# Patient Record
Sex: Male | Born: 1970 | Race: White | Hispanic: No | Smoking: Current every day smoker
Health system: Southern US, Community
[De-identification: ages and names within clinical notes are randomized; demographics above are authoritative.]

## PROBLEM LIST (undated history)

## (undated) DIAGNOSIS — F329 Major depressive disorder, single episode, unspecified: Secondary | ICD-10-CM

## (undated) DIAGNOSIS — F32A Depression, unspecified: Secondary | ICD-10-CM

## (undated) DIAGNOSIS — I10 Essential (primary) hypertension: Secondary | ICD-10-CM

## (undated) HISTORY — PX: ELBOW FRACTURE SURGERY: SHX616

## (undated) HISTORY — PX: ABDOMINAL SURGERY: SHX537

---

## 2009-08-03 ENCOUNTER — Emergency Department (HOSPITAL_COMMUNITY): Admission: EM | Admit: 2009-08-03 | Discharge: 2009-08-03 | Payer: Self-pay | Admitting: Emergency Medicine

## 2012-03-12 ENCOUNTER — Emergency Department (HOSPITAL_COMMUNITY)
Admission: EM | Admit: 2012-03-12 | Discharge: 2012-03-12 | Disposition: A | Discharge status: Home / Self Care | Payer: No Typology Code available for payment source | Attending: Emergency Medicine | Admitting: Emergency Medicine

## 2012-03-12 ENCOUNTER — Emergency Department (HOSPITAL_COMMUNITY): Payer: No Typology Code available for payment source

## 2012-03-12 ENCOUNTER — Encounter (HOSPITAL_COMMUNITY): Payer: Self-pay | Admitting: Emergency Medicine

## 2012-03-12 DIAGNOSIS — R05 Cough: Secondary | ICD-10-CM | POA: Insufficient documentation

## 2012-03-12 DIAGNOSIS — Z79899 Other long term (current) drug therapy: Secondary | ICD-10-CM | POA: Insufficient documentation

## 2012-03-12 DIAGNOSIS — R059 Cough, unspecified: Secondary | ICD-10-CM | POA: Insufficient documentation

## 2012-03-12 DIAGNOSIS — B349 Viral infection, unspecified: Secondary | ICD-10-CM

## 2012-03-12 DIAGNOSIS — I10 Essential (primary) hypertension: Secondary | ICD-10-CM | POA: Insufficient documentation

## 2012-03-12 DIAGNOSIS — F17200 Nicotine dependence, unspecified, uncomplicated: Secondary | ICD-10-CM | POA: Insufficient documentation

## 2012-03-12 DIAGNOSIS — B9789 Other viral agents as the cause of diseases classified elsewhere: Secondary | ICD-10-CM | POA: Insufficient documentation

## 2012-03-12 DIAGNOSIS — J3489 Other specified disorders of nose and nasal sinuses: Secondary | ICD-10-CM | POA: Insufficient documentation

## 2012-03-12 DIAGNOSIS — R6883 Chills (without fever): Secondary | ICD-10-CM | POA: Insufficient documentation

## 2012-03-12 DIAGNOSIS — J029 Acute pharyngitis, unspecified: Secondary | ICD-10-CM | POA: Insufficient documentation

## 2012-03-12 HISTORY — DX: Essential (primary) hypertension: I10

## 2012-03-12 HISTORY — DX: Major depressive disorder, single episode, unspecified: F32.9

## 2012-03-12 HISTORY — DX: Depression, unspecified: F32.A

## 2012-03-12 LAB — RAPID STREP SCREEN (MED CTR MEBANE ONLY): Streptococcus, Group A Screen (Direct): NEGATIVE

## 2012-03-12 MED ORDER — IBUPROFEN 400 MG PO TABS
400.0000 mg | ORAL_TABLET | Freq: Once | ORAL | Status: AC
  Administered 2012-03-12: 400 mg via ORAL
  Filled 2012-03-12: qty 1

## 2012-03-12 MED ORDER — ACETAMINOPHEN 500 MG PO TABS
1000.0000 mg | ORAL_TABLET | Freq: Once | ORAL | Status: AC
  Administered 2012-03-12: 1000 mg via ORAL
  Filled 2012-03-12: qty 2

## 2012-03-12 NOTE — ED Provider Notes (Signed)
History     CSN: [id number]  Arrival date & time 03/12/12  0809   First MD Initiated Contact with Patient 03/12/12 [phone]      Chief Complaint  Patient presents with  . Influenza     HPI Pt was seen at 0840.   Per pt, c/o gradual onset and persistence of constant generalized body aches/fatigue, sore throat, runny/stuffy nose, sinus congestion, chills and cough for the past 2-3 days.  Denies rash, no CP/SOB, no N/V/D, no abd pain, no back pain.     Past Medical History  Diagnosis Date  . Hypertension   . Depression     Past Surgical History  Procedure Date  . Elbow fracture surgery     History  Substance Use Topics  . Smoking status: Current Every Day Smoker -- 1.0 packs/day    Types: Cigarettes  . Smokeless tobacco: Not on file  . Alcohol Use: No     Review of Systems ROS: Statement: All systems negative except as marked or noted in the HPI; Constitutional: +fever and chills, generalized body aches/fatigue. ; ; Eyes: Negative for eye pain, redness and discharge. ; ; ENMT: Negative for ear pain, hoarseness, +nasal congestion, sinus pressure and sore throat. ; ; Cardiovascular: Negative for chest pain, palpitations, diaphoresis, dyspnea and peripheral edema. ; ; Respiratory: +cough. Negative for wheezing and stridor. ; ; Gastrointestinal: Negative for nausea, vomiting, diarrhea, abdominal pain, blood in stool, hematemesis, jaundice and rectal bleeding. . ; ; Genitourinary: Negative for dysuria, flank pain and hematuria. ; ; Musculoskeletal: Negative for back pain and neck pain. Negative for swelling and trauma.; ; Skin: Negative for pruritus, rash, abrasions, blisters, bruising and skin lesion.; ; Neuro: Negative for headache, lightheadedness and neck stiffness. Negative for altered level of consciousness , altered mental status, extremity weakness, paresthesias, involuntary movement, seizure and syncope.       Allergies  Amoxapine and related and Amoxicillin  Home  Medications  No current outpatient prescriptions on file.  BP 158/93  Pulse 118  Temp 102.4 F (39.1 C) (Oral)  Resp 24  Ht 6' (1.829 m)  Wt 245 lb (111.131 kg)  BMI 33.23 kg/m2  SpO2 95%  Physical Exam 0845: Physical examination:  Nursing notes reviewed; Vital signs and O2 SAT reviewed;  Constitutional: Well developed, Well nourished, Well hydrated, In no acute distress; Head:  Normocephalic, atraumatic; Eyes: EOMI, PERRL, No scleral icterus; ENMT: TM's clear bilat. +edemetous nasal turbinates bilat with clear rhinorrhea.  Mouth and pharynx without lesions. No tonsillar exudates. No intra-oral edema. No hoarse voice, no drooling, no stridor. No pain with manipulation of larynx. Mouth and pharynx normal, Mucous membranes moist; Neck: Supple, Full range of motion, No lymphadenopathy; Cardiovascular: Tachycardic rate and rhythm, No murmur, rub, or gallop; Respiratory: Breath sounds clear & equal bilaterally, No rales, rhonchi, wheezes.  Speaking full sentences with ease, Normal respiratory effort/excursion; Chest: Nontender, Movement normal; Abdomen: Soft, Nontender, Nondistended, Normal bowel sounds;; Extremities: Pulses normal, No tenderness, No edema, No calf edema or asymmetry.; Neuro: AA&Ox3, Major CN grossly intact.  Speech clear. Gait steady. Climbs on and off stretcher by himself with ease. No gross focal motor or sensory deficits in extremities.; Skin: Color normal, Warm, Dry.   ED Course  Procedures    MDM  MDM Reviewed: nursing note, vitals and previous chart Interpretation: labs and x-ray   Results for orders placed during the hospital encounter of 03/12/12  RAPID STREP SCREEN      Component Value Range   Streptococcus,  Group A Screen (Direct) NEGATIVE  NEGATIVE   Dg Chest 2 View 03/12/2012  *RADIOLOGY REPORT*  Clinical Data: Cough, congestion, fever, smoker  CHEST - 2 VIEW  Comparison:  None.  Findings:  The heart size and mediastinal contours are within normal limits.   Both lungs are clear.  The visualized skeletal structures are unremarkable.  IMPRESSION: No active cardiopulmonary disease.   Original Report Authenticated By: Judie Petit. Shick, M.D.       1115:  Fever improved after tylenol/motrin.  No acute findings on CXR, no strep throat.  Wants to go home now. Dx and testing d/w pt.  Questions answered.  Verb understanding, agreeable to d/c home with outpt f/u.        Laray Anger, DO 03/13/12 Corky Crafts

## 2012-03-12 NOTE — ED Notes (Signed)
Pt sitting in hallway bed awaiting discharge papers.

## 2012-03-12 NOTE — ED Notes (Signed)
Pt states symptoms began two days ago.

## 2014-09-03 IMAGING — CR DG CHEST 2V
2 series · 2 of 2 positions shown · non-contrast
Comparison: None.

CLINICAL DATA: Cough, congestion, fever, smoker

CHEST - 2 VIEW

[view not recorded (1 of 2)]
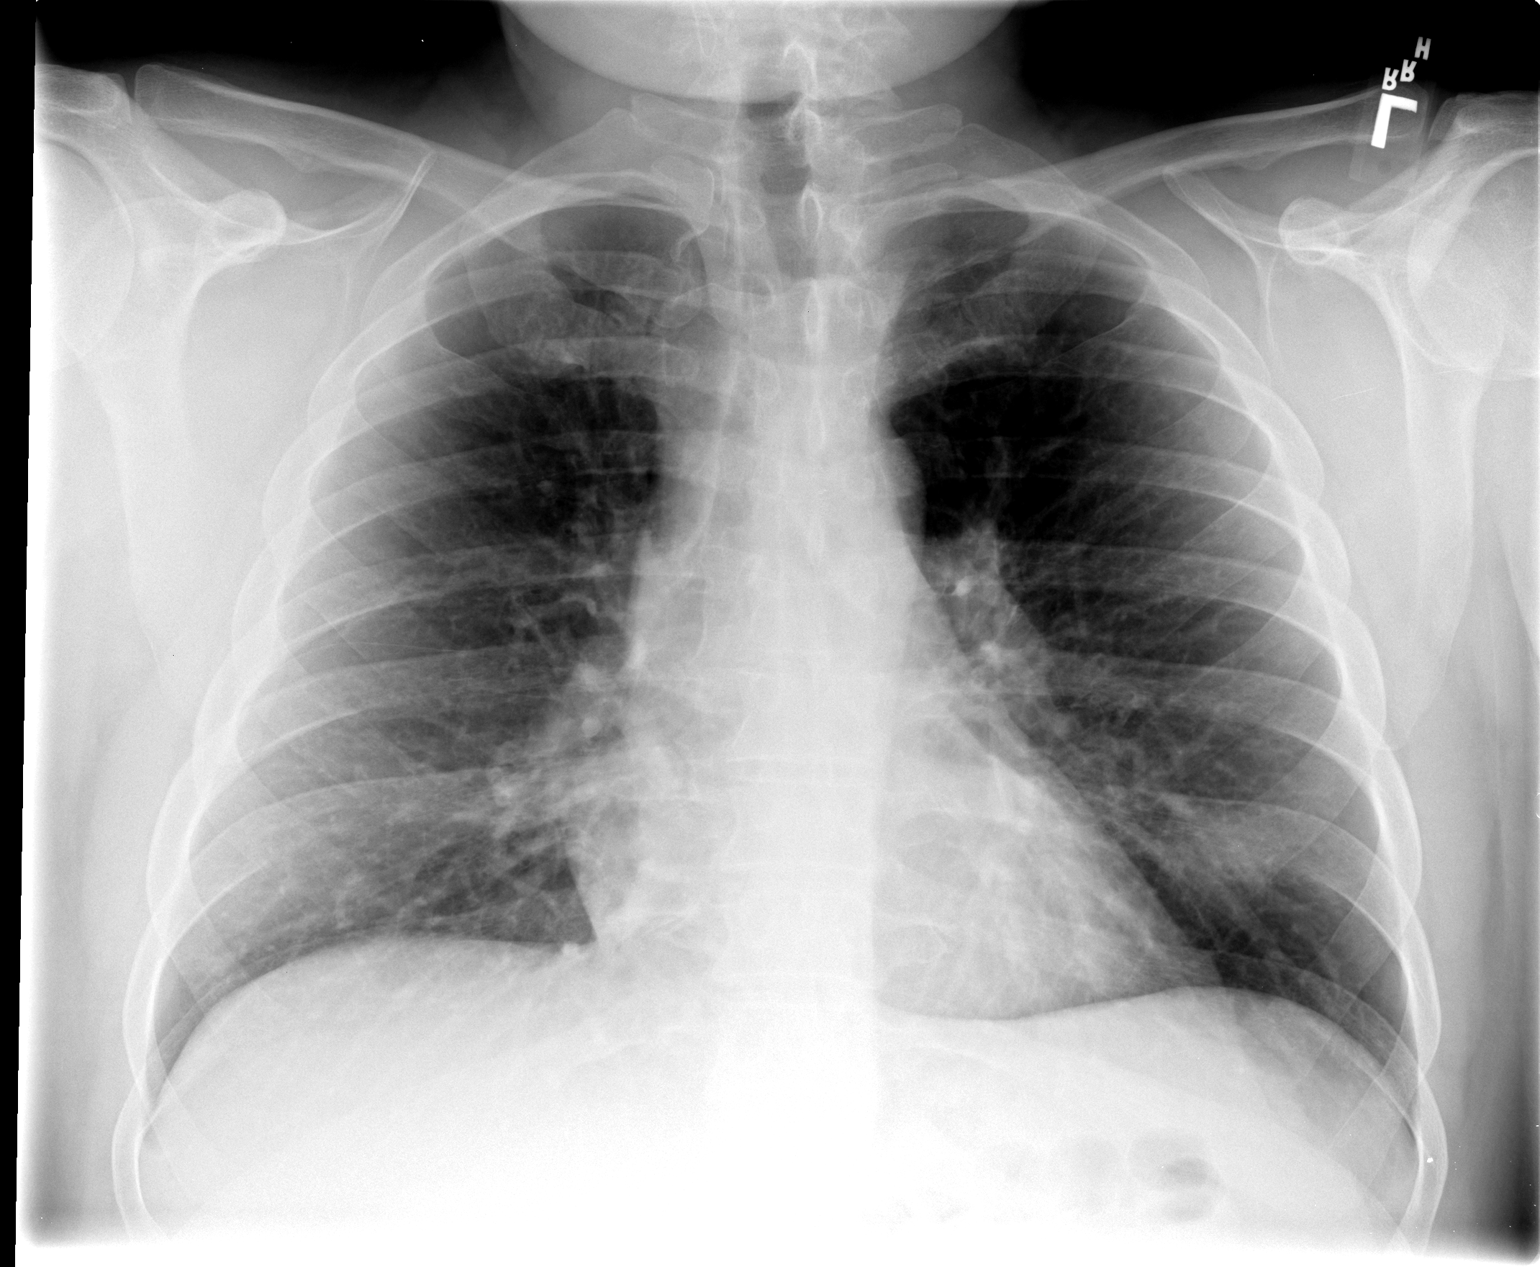

[view not recorded (2 of 2)]
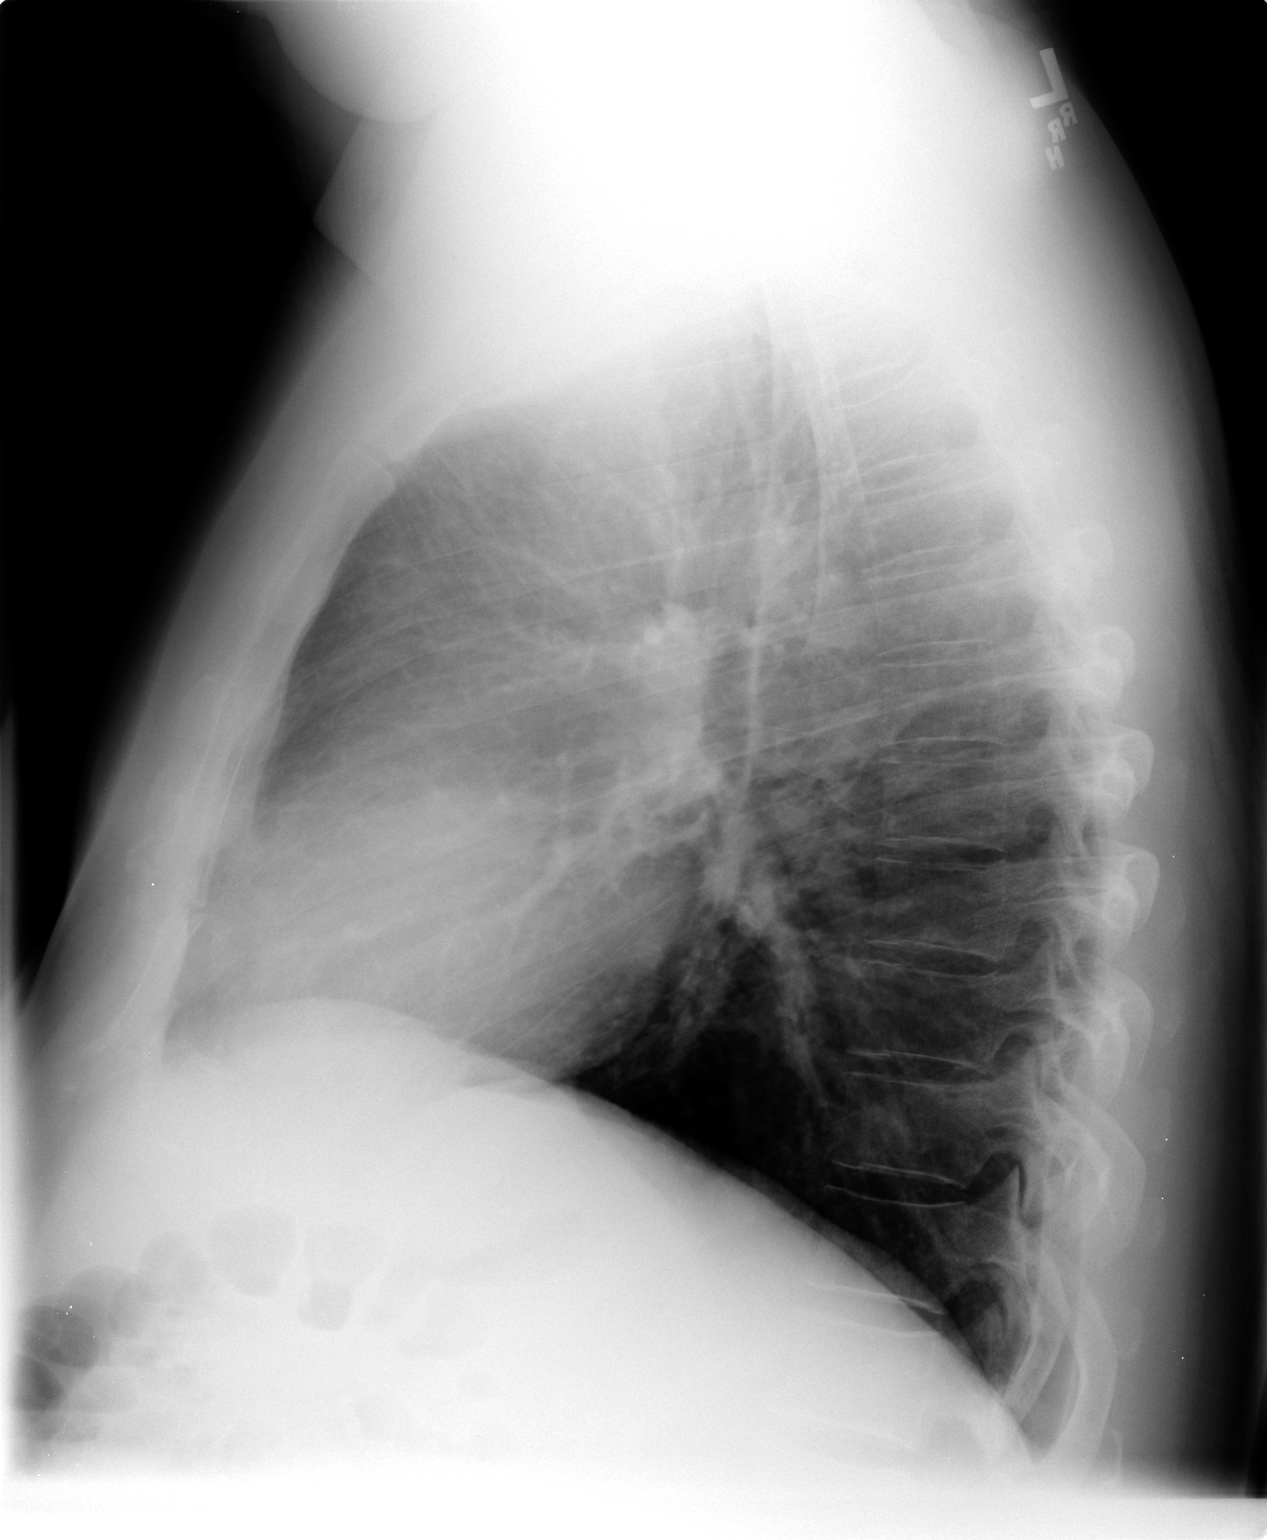

[2 of 2 positions shown; findings below may reference images not displayed]

FINDINGS: The heart size and mediastinal contours are within
normal limits.  Both lungs are clear.  The visualized skeletal
structures are unremarkable.
IMPRESSION: No active cardiopulmonary disease.

## 2019-02-14 ENCOUNTER — Other Ambulatory Visit: Payer: Self-pay

## 2019-02-14 ENCOUNTER — Ambulatory Visit
Admission: EM | Admit: 2019-02-14 | Discharge: 2019-02-14 | Disposition: A | Discharge status: Home / Self Care | Payer: 59
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
## 2019-05-07 ENCOUNTER — Ambulatory Visit: Payer: 59 | Attending: Internal Medicine

## 2019-05-07 DIAGNOSIS — Z23 Encounter for immunization: Secondary | ICD-10-CM | POA: Insufficient documentation

## 2019-05-07 NOTE — Progress Notes (Signed)
   Covid-19 Vaccination Clinic  Name:  GEOFREY SILLIMAN    MRN: [id number] DOB: 06/23/1970  05/07/2019  Mr. Ovens was observed post Covid-19 immunization for 15 minutes without incident. He was provided with Vaccine Information Sheet and instruction to access the V-Safe system.   Mr. Hemann was instructed to call 911 with any severe reactions post vaccine: Marland Kitchen Difficulty breathing  . Swelling of face and throat  . A fast heartbeat  . A bad rash all over body  . Dizziness and weakness   Immunizations Administered    Name Date Dose VIS Date Route   Moderna COVID-19 Vaccine 05/07/2019  9:24 AM 0.5 mL 02/02/2019 Intramuscular   Manufacturer: Moderna   Lot: [id number]   NDC: 18367-255-00

## 2019-06-08 ENCOUNTER — Ambulatory Visit: Payer: 59 | Attending: Internal Medicine

## 2019-06-08 DIAGNOSIS — Z23 Encounter for immunization: Secondary | ICD-10-CM

## 2019-06-08 NOTE — Progress Notes (Signed)
   Covid-19 Vaccination Clinic  Name:  Sean Jordan    MRN: [id number] DOB: November 06, 1970  06/08/2019  Mr. Sean Jordan was observed post Covid-19 immunization for 15 minutes without incident. He was provided with Vaccine Information Sheet and instruction to access the V-Safe system.   Mr. Sean Jordan was instructed to call 911 with any severe reactions post vaccine: Marland Kitchen Difficulty breathing  . Swelling of face and throat  . A fast heartbeat  . A bad rash all over body  . Dizziness and weakness   Immunizations Administered    Name Date Dose VIS Date Route   Moderna COVID-19 Vaccine 06/08/2019 10:21 AM 0.5 mL 02/02/2019 Intramuscular   Manufacturer: Moderna   Lot: [id number]   NDC: 55258-948-34

## 2022-01-10 ENCOUNTER — Encounter (HOSPITAL_COMMUNITY): Payer: Self-pay

## 2022-01-10 ENCOUNTER — Ambulatory Visit (INDEPENDENT_AMBULATORY_CARE_PROVIDER_SITE_OTHER): Payer: 59 | Admitting: Clinical

## 2022-01-10 DIAGNOSIS — F17200 Nicotine dependence, unspecified, uncomplicated: Secondary | ICD-10-CM

## 2022-01-10 DIAGNOSIS — F4310 Post-traumatic stress disorder, unspecified: Secondary | ICD-10-CM | POA: Diagnosis not present

## 2022-01-10 DIAGNOSIS — F1020 Alcohol dependence, uncomplicated: Secondary | ICD-10-CM | POA: Diagnosis not present

## 2022-01-10 DIAGNOSIS — F419 Anxiety disorder, unspecified: Secondary | ICD-10-CM

## 2022-01-10 DIAGNOSIS — F331 Major depressive disorder, recurrent, moderate: Secondary | ICD-10-CM

## 2022-01-10 NOTE — Plan of Care (Signed)
Verbal Consent 
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
## 2022-02-21 ENCOUNTER — Ambulatory Visit (INDEPENDENT_AMBULATORY_CARE_PROVIDER_SITE_OTHER): Payer: 59 | Admitting: Clinical

## 2022-02-21 DIAGNOSIS — F17200 Nicotine dependence, unspecified, uncomplicated: Secondary | ICD-10-CM | POA: Diagnosis not present

## 2022-02-21 DIAGNOSIS — F4310 Post-traumatic stress disorder, unspecified: Secondary | ICD-10-CM | POA: Diagnosis not present

## 2022-02-21 DIAGNOSIS — F1020 Alcohol dependence, uncomplicated: Secondary | ICD-10-CM | POA: Diagnosis not present

## 2022-02-21 DIAGNOSIS — F419 Anxiety disorder, unspecified: Secondary | ICD-10-CM

## 2022-02-21 DIAGNOSIS — F331 Major depressive disorder, recurrent, moderate: Secondary | ICD-10-CM
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
## 2022-03-11 ENCOUNTER — Other Ambulatory Visit (HOSPITAL_COMMUNITY): Payer: Self-pay | Admitting: Internal Medicine

## 2022-03-11 DIAGNOSIS — Z122 Encounter for screening for malignant neoplasm of respiratory organs: Secondary | ICD-10-CM

## 2022-03-11 DIAGNOSIS — F17210 Nicotine dependence, cigarettes, uncomplicated: Secondary | ICD-10-CM

## 2022-03-28 ENCOUNTER — Ambulatory Visit (INDEPENDENT_AMBULATORY_CARE_PROVIDER_SITE_OTHER): Payer: 59 | Admitting: Clinical

## 2022-03-28 DIAGNOSIS — F331 Major depressive disorder, recurrent, moderate: Secondary | ICD-10-CM

## 2022-03-28 DIAGNOSIS — F1020 Alcohol dependence, uncomplicated: Secondary | ICD-10-CM

## 2022-03-28 DIAGNOSIS — F4310 Post-traumatic stress disorder, unspecified: Secondary | ICD-10-CM | POA: Diagnosis not present

## 2022-03-28 DIAGNOSIS — F17200 Nicotine dependence, unspecified, uncomplicated: Secondary | ICD-10-CM | POA: Diagnosis not present

## 2022-03-28 DIAGNOSIS — F419 Anxiety disorder, unspecified: Secondary | ICD-10-CM
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
## 2022-03-29 ENCOUNTER — Ambulatory Visit (HOSPITAL_COMMUNITY)
Admission: RE | Admit: 2022-03-29 | Discharge: 2022-03-29 | Disposition: A | Discharge status: Home / Self Care | Payer: 59 | Source: Ambulatory Visit | Attending: Internal Medicine | Admitting: Internal Medicine

## 2022-03-29 DIAGNOSIS — F17210 Nicotine dependence, cigarettes, uncomplicated: Secondary | ICD-10-CM | POA: Diagnosis present

## 2022-03-29 DIAGNOSIS — Z122 Encounter for screening for malignant neoplasm of respiratory organs: Secondary | ICD-10-CM | POA: Insufficient documentation

## 2022-04-08 ENCOUNTER — Ambulatory Visit: Payer: 59 | Attending: Internal Medicine | Admitting: Internal Medicine

## 2022-04-08 ENCOUNTER — Other Ambulatory Visit: Payer: Self-pay

## 2022-04-08 ENCOUNTER — Encounter: Payer: Self-pay | Admitting: Internal Medicine

## 2022-04-08 VITALS — BP 166/100 | HR 88 | Ht 70.0 in | Wt 189.0 lb

## 2022-04-08 DIAGNOSIS — R0609 Other forms of dyspnea: Secondary | ICD-10-CM | POA: Diagnosis not present

## 2022-04-08 DIAGNOSIS — I10 Essential (primary) hypertension: Secondary | ICD-10-CM

## 2022-04-08 DIAGNOSIS — Z720 Tobacco use: Secondary | ICD-10-CM | POA: Diagnosis not present

## 2022-04-08 DIAGNOSIS — E782 Mixed hyperlipidemia: Secondary | ICD-10-CM

## 2022-04-08 DIAGNOSIS — I2510 Atherosclerotic heart disease of native coronary artery without angina pectoris: Secondary | ICD-10-CM

## 2022-04-08 DIAGNOSIS — I700 Atherosclerosis of aorta: Secondary | ICD-10-CM

## 2022-04-08 DIAGNOSIS — I2584 Coronary atherosclerosis due to calcified coronary lesion: Secondary | ICD-10-CM

## 2022-04-08 MED ORDER — LISINOPRIL 10 MG PO TABS: 10.0000 mg | ORAL_TABLET | Freq: Every day | ORAL | 3 refills | Status: DC

## 2022-04-08 NOTE — Patient Instructions (Signed)
Medication Instructions:  Your physician has recommended you make the following change in your medication:  INCREASE: lisinopril to 10 mg by mouth once daily  *If you need a refill on your cardiac medications before your next appointment, please call your pharmacy*   Lab Work: [place]: BMP If you have labs (blood work) drawn today and your tests are completely normal, you will receive your results only by: [place] (if you have MyChart) OR A paper copy in the mail If you have any lab test that is abnormal or we need to change your treatment, we will call you to review the results.   Testing/Procedures: Your physician has requested that you have en exercise stress myoview. For further information please visit [email or web address]. Please follow instruction sheet, as given.   You are scheduled for a Myocardial Perfusion Imaging Study. Please arrive 15 minutes prior to your appointment time for registration and insurance purposes.   The test will take approximately 3 to 4 hours to complete; you may bring reading material.  If someone comes with you to your appointment, they will need to remain in the main lobby due to limited space in the testing area.    How to prepare for your Myocardial Perfusion Test: Do not eat or drink 3 hours prior to your test, except you may have water. Do not consume products containing caffeine (regular or decaffeinated) 12 hours prior to your test. (ex: coffee, chocolate, sodas, tea). Do bring a list of your current medications with you.  If not listed below, you may take your medications as normal. Do wear comfortable clothes (no dresses or overalls) and walking shoes, tennis shoes preferred (No heels or open toe shoes are allowed). Do NOT wear cologne, perfume, aftershave, or lotions (deodorant is allowed). If these instructions are not followed, your test will have to be rescheduled.  If you cannot keep your appointment,  please provide 24 hours notification to the Nuclear Lab, to avoid a possible $50 charge to your account.       Follow-Up: At [facility], you and your health needs are our priority.  As part of our continuing mission to provide you with exceptional heart care, we have created designated Provider Care Teams.  These Care Teams include your primary Cardiologist (physician) and Advanced Practice Providers (APPs -  Physician Assistants and Nurse Practitioners) who all work together to provide you with the care you need, when you need it.    Your next appointment:   6 month(s)  Provider:   Werner Lean, MD

## 2022-04-08 NOTE — Progress Notes (Signed)
Cardiology Office Note:    Date:  04/08/2022   ID:  Sean Jordan, DOB 09/03/1970, MRN [id number]  PCP:  Eber Hong, [place] Providers Cardiologist:  Werner Lean, MD     Referring MD: Eber Hong, MD   CC: Reviewed incidental findings Consulted for the evaluation of CAC at the behest of Dr. Brynda Greathouse  History of Present Illness:    Sean Jordan is a 52 y.o. male with a hx of CAC in the setting of HLD, HTN.  Patient notes that he is feeling OK.   He works as  Financial trader to rework a Scientist, physiological.  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  No PND or orthopnea.  No weight gain, leg swelling , or abdominal swelling.  No syncope or near syncope . Notes  no palpitations or funny heart beats.     Patient reports prior cardiac testing including CT for lung cancer screening  Down to a pack a day.  Used to be pretty bad. Uses his Apple Watch and sets intervals of where he is aloud to smoke; he has increased the interval every day.   Past Medical History:  Diagnosis Date   Depression    Hypertension     Past Surgical History:  Procedure Laterality Date   ABDOMINAL SURGERY     ELBOW FRACTURE SURGERY      Current Medications: Current Meds  Medication Sig   aspirin EC 81 MG tablet Take by mouth daily.   atorvastatin (LIPITOR) 40 MG tablet Take 40 mg by mouth daily.   buPROPion (WELLBUTRIN SR) 150 MG 12 hr tablet Take 150 mg by mouth 2 (two) times daily.   cetirizine (ZYRTEC) 10 MG tablet Take by mouth daily.   hydrOXYzine (VISTARIL) 25 MG capsule Take 25-50 mg by mouth at bedtime as needed for anxiety or itching.   lisinopril (ZESTRIL) 10 MG tablet Take 1 tablet (10 mg total) by mouth daily.   naproxen (NAPROSYN) 500 MG tablet Take 500 mg by mouth daily at 6 (six) AM.    Omega-3 Fatty Acids (OMEGA-3 2100 PO) Take by mouth daily at 6 (six) AM.   sertraline (ZOLOFT) 100 MG tablet Take 100 mg by mouth daily.   sildenafil (VIAGRA) 100 MG tablet Take 100 mg by mouth as needed for erectile dysfunction.   [DISCONTINUED] lisinopril (PRINIVIL,ZESTRIL) 5 MG tablet Take 5 mg by mouth daily.     Allergies:   Amoxicillin   Social History   Socioeconomic History   Marital status: Divorced    Spouse name: Not on file   Number of children: Not on file   Years of education: Not on file   Highest education level: Not on file  Occupational History   Not on file  Tobacco Use   Smoking status: Every Day    Packs/day: 1.00    Types: Cigarettes   Smokeless tobacco: Never  Substance and Sexual Activity   Alcohol use: Yes    Comment: occ   Drug use: Not on file   Sexual activity: Not on file  Other Topics Concern   Not on file  Social History Narrative   Not on file   Social Determinants of Health   Financial Resource Strain: Not on file  Food Insecurity: Not  on file  Transportation Needs: Not on file  Physical Activity: Not on file  Stress: Not on file  Social Connections: Not on file     Family History: The patient's family history includes Healthy in his father and mother. GF had CHF.  ROS:   Please see the history of present illness.     All other systems reviewed and are negative.  EKGs/Labs/Other Studies Reviewed:    The following studies were reviewed today:   EKG:  EKG is  ordered today.  The ekg ordered today demonstrates  Sr rate 88  NonCardiac CT : Date: 03/29/22 Results: CAC and aortic atherosclerosis    Recent Labs: No results found for requested labs within last 365 days.  Recent Lipid Panel No results found for: "CHOL", "TRIG", "HDL", "CHOLHDL", "VLDL", "LDLCALC", "LDLDIRECT"   Risk Assessment/Calculations:         HYPERTENSION CONTROL Vitals:   04/08/22 1005 04/08/22 1049  BP: (!) 158/98 (!) 166/100    The  patient's blood pressure is elevated above target today.  In order to address the patient's elevated BP: A new medication was prescribed today.            Physical Exam:    VS:  BP (!) 166/100   Pulse 88   Ht 5\' 10"  (1.778 m)   Wt 189 lb (85.7 kg)   SpO2 99%   BMI 27.12 kg/m     Wt Readings from Last 3 Encounters:  04/08/22 189 lb (85.7 kg)  03/12/12 245 lb (111.1 kg)     GEN:  Well nourished, well developed anxious HEENT: F Ear Frank Sign NECK: No JVD; No carotid bruits CARDIAC: RRR, no murmurs, rubs, gallops RESPIRATORY:  Clear to auscultation without rales, wheezing or rhonchi  ABDOMEN: Soft, non-tender, non-distended MUSCULOSKELETAL:  No edema; No deformity  SKIN: Warm and dry NEUROLOGIC:  Alert and oriented x 3 PSYCHIATRIC:  Normal affect   ASSESSMENT:    1. DOE (dyspnea on exertion)   2. Tobacco use   3. Mixed hyperlipidemia   4. Coronary artery calcification   5. Aortic atherosclerosis ([place])   6. Essential hypertension    PLAN:    CAC Aortic atherosclerosis HLD Tobacco abuse DOE - LDL at goal - reviewed CT scan - LDL at goal - will get Exercise NM Stress test - Discussed Tobacco cessation - reviewed the limited evidence of Vitamin C and D for this; limited evidence of niacin - Discussed small positive for L citrulline and L argiine; discussed AE and recs to not start all of them together if he wishes to take OTC supplements - discussed small data for nattokinase  HTN - increase lisinopril to 10 mg; f/u BMP in [place] - AMB BP monitoring - discussed physical activity increase  Summer f/u unless new sx        Medication Adjustments/Labs and Tests Ordered: Current medicines are reviewed at length with the patient today.  Concerns regarding medicines are outlined above.  Orders Placed This Encounter  Procedures   Basic metabolic panel   Cardiac Stress Test: Informed Consent Details: Physician/Practitioner Attestation; Transcribe to  consent form and obtain patient signature   MYOCARDIAL PERFUSION IMAGING   EKG 12-Lead   Meds ordered this encounter  Medications   lisinopril (ZESTRIL) 10 MG tablet    Sig: Take 1 tablet (10 mg total) by mouth daily.    Dispense:  90 tablet    Refill:  3    Patient Instructions  Medication Instructions:  Your physician has recommended you make the following change in your medication:  INCREASE: lisinopril to 10 mg by mouth once daily  *If you need a refill on your cardiac medications before your next appointment, please call your pharmacy*   Lab Work: [place]: BMP If you have labs (blood work) drawn today and your tests are completely normal, you will receive your results only by: [place] (if you have MyChart) OR A paper copy in the mail If you have any lab test that is abnormal or we need to change your treatment, we will call you to review the results.   Testing/Procedures: Your physician has requested that you have en exercise stress myoview. For further information please visit [email or web address]. Please follow instruction sheet, as given.   You are scheduled for a Myocardial Perfusion Imaging Study. Please arrive 15 minutes prior to your appointment time for registration and insurance purposes.   The test will take approximately 3 to 4 hours to complete; you may bring reading material.  If someone comes with you to your appointment, they will need to remain in the main lobby due to limited space in the testing area.    How to prepare for your Myocardial Perfusion Test: Do not eat or drink 3 hours prior to your test, except you may have water. Do not consume products containing caffeine (regular or decaffeinated) 12 hours prior to your test. (ex: coffee, chocolate, sodas, tea). Do bring a list of your current medications with you.  If not listed below, you may take your medications as normal. Do wear comfortable clothes (no dresses or  overalls) and walking shoes, tennis shoes preferred (No heels or open toe shoes are allowed). Do NOT wear cologne, perfume, aftershave, or lotions (deodorant is allowed). If these instructions are not followed, your test will have to be rescheduled.  If you cannot keep your appointment, please provide 24 hours notification to the Nuclear Lab, to avoid a possible $50 charge to your account.       Follow-Up: At [facility], you and your health needs are our priority.  As part of our continuing mission to provide you with exceptional heart care, we have created designated Provider Care Teams.  These Care Teams include your primary Cardiologist (physician) and Advanced Practice Providers (APPs -  Physician Assistants and Nurse Practitioners) who all work together to provide you with the care you need, when you need it.    Your next appointment:   6 month(s)  Provider:   Werner Lean, MD       Signed, Werner Lean, MD  04/08/2022 10:56 AM    [place]

## 2022-04-11 ENCOUNTER — Encounter: Payer: Self-pay | Admitting: Internal Medicine

## 2022-04-11 ENCOUNTER — Telehealth: Payer: Self-pay

## 2022-04-11 ENCOUNTER — Telehealth (HOSPITAL_COMMUNITY): Payer: Self-pay

## 2022-04-11 DIAGNOSIS — I10 Essential (primary) hypertension: Secondary | ICD-10-CM

## 2022-04-11 MED ORDER — LISINOPRIL 40 MG PO TABS: 40.0000 mg | ORAL_TABLET | Freq: Every day | ORAL | 3 refills | Status: DC

## 2022-04-11 NOTE — Telephone Encounter (Signed)
Patient sent in Accident message asking what dose of lisinopril he was supposed to be on, stated he thought he was supposed to be taking 20 mg previously and was told to double his dose at his last visit. Epic shows that he was on 5 mg lisinopril as prescribed by Dr. Dan Maker. On last visit 04/08/22 with Dr. Dan Maker, his BP was 166/100 and 158/98. Dr. Dan Maker increased lisinopril at that time to 10 mg daily. Patient states he received a call from his pharmacy asking whether his dose of lisinopril was supposed to be 20 or 40 mg daily and called [place] to clarify.   I had patient read off the label of his lisinopril bottle to me. He read that the label stated 20 mg of lisinopril (I had him spell it out) and that it was prescribed by Dr. Eber Hong, his PCP. I also checked that his lipitor dosage (40 mg) was correct in case he was getting the names of his medications confused.   Patient stated that he took 2 doses of his 20 mg tablets of lisinopril on 04/09/22 because his understanding was that Dr. Dan Maker had doubled his dosage. Patient states he checked his BP on 04/09/22 and it was 168/107 and later 154/95. He denies any dizziness or lightheadedness.   I advised patient not to double his dosage until we are able to clarify his dosage with Dr. Dan Maker and provided education about symptoms of low pressure. Provided education that increasing his blood pressure medication too quickly can lead to dizziness and lightheadedness, even proceeding to passing out. Patient agrees not to only take 1 tablet of 20 mg lisinopril, which he states he has been on for a long time as prescribed by Dr. Eber Hong his PCP, until Dr. Dan Maker can review his medications and advise what dose he should be on. Patient verbalizes understanding and agrees to plan. Forwarded to Dr. Dan Maker and to PCP.

## 2022-04-11 NOTE — Telephone Encounter (Signed)
Spoke with the patient, detailed instructions given. He stated that he would be here for his test. Asked to call back with any questions. S.Maveryck Bahri EMTP/CCT 

## 2022-04-11 NOTE — Telephone Encounter (Signed)
Spoke with patient and advised per Dr. Gasper Sells that he is to increase lisinopril to 40 mg daily. Orders placed for updated dose at patient's preferred pharmacy. Reviewed plan to check BMP after he has been on increased dose of lisinopril for 1 week. Patient verbalized understanding and agrees to plan.

## 2022-04-16 ENCOUNTER — Encounter (HOSPITAL_COMMUNITY): Payer: 59

## 2022-04-30 LAB — BASIC METABOLIC PANEL
BUN/Creatinine Ratio: 14 (ref 9–20)
BUN: 10 mg/dL (ref 6–24)
CO2: 24 mmol/L (ref 20–29)
Calcium: 9.3 mg/dL (ref 8.7–10.2)
Chloride: 94 mmol/L — ABNORMAL LOW (ref 96–106)
Creatinine, Ser: 0.73 mg/dL — ABNORMAL LOW (ref 0.76–1.27)
Glucose: 87 mg/dL (ref 70–99)
Potassium: 4.2 mmol/L (ref 3.5–5.2)
Sodium: 131 mmol/L — ABNORMAL LOW (ref 134–144)
eGFR: 111 mL/min/{1.73_m2} (ref >59)

## 2022-05-02 ENCOUNTER — Telehealth: Payer: Self-pay

## 2022-05-02 DIAGNOSIS — I10 Essential (primary) hypertension: Secondary | ICD-10-CM

## 2022-05-02 NOTE — Telephone Encounter (Signed)
The patient has been notified of the result and verbalized understanding.  All questions (if any) were answered. Precious Gilding, RN 05/02/2022 1:52 PM   Will have labs drawn on 05/06/22 pt already has an appointment for a stress test.

## 2022-05-02 NOTE — Telephone Encounter (Signed)
-----   Message from Werner Lean, MD sent at 05/01/2022 11:36 AM EST ----- Results: Hyponateremia of non cardiac etiology Plan: Repeat BMP and if still decreased send to PCP  Werner Lean, MD

## 2022-05-03 ENCOUNTER — Telehealth (HOSPITAL_COMMUNITY): Payer: Self-pay | Admitting: *Deleted

## 2022-05-03 NOTE — Telephone Encounter (Signed)
Patient given detailed instructions per Myocardial Perfusion Study Information Sheet for the test on 05/06/2022 at 8:00. Patient notified to arrive 15 minutes early and that it is imperative to arrive on time for appointment to keep from having the test rescheduled.  If you need to cancel or reschedule your appointment, please call the office within 24 hours of your appointment. . Patient verbalized understanding.Sean Jordan

## 2022-05-06 ENCOUNTER — Ambulatory Visit: Payer: 59

## 2022-05-06 ENCOUNTER — Ambulatory Visit: Payer: 59 | Attending: Internal Medicine

## 2022-05-06 ENCOUNTER — Encounter: Payer: Self-pay | Admitting: Internal Medicine

## 2022-05-06 DIAGNOSIS — R0609 Other forms of dyspnea: Secondary | ICD-10-CM

## 2022-05-06 DIAGNOSIS — I10 Essential (primary) hypertension: Secondary | ICD-10-CM | POA: Diagnosis present

## 2022-05-06 LAB — BASIC METABOLIC PANEL
BUN/Creatinine Ratio: 11 (ref 9–20)
BUN: 7 mg/dL (ref 6–24)
CO2: 25 mmol/L (ref 20–29)
Calcium: 9.3 mg/dL (ref 8.7–10.2)
Chloride: 100 mmol/L (ref 96–106)
Creatinine, Ser: 0.66 mg/dL — ABNORMAL LOW (ref 0.76–1.27)
Glucose: 74 mg/dL (ref 70–99)
Potassium: 4.4 mmol/L (ref 3.5–5.2)
Sodium: 136 mmol/L (ref 134–144)
eGFR: 114 mL/min/{1.73_m2} (ref >59)

## 2022-05-06 LAB — MYOCARDIAL PERFUSION IMAGING
Estimated workload: 10.1
Exercise duration (min): 9 min
LV dias vol: 127 mL (ref 62–150)
LV sys vol: 47 mL
MPHR: 170 {beats}/min
Nuc Stress EF: 64 %
Peak HR: 151 {beats}/min
Percent HR: 89 %
Rest HR: 81 {beats}/min
Rest Nuclear Isotope Dose: 8.6 mCi
SDS: 1
SRS: 0
SSS: 1
ST Depression (mm): 0 mm
Stress Nuclear Isotope Dose: 28.7 mCi
TID: 0.89

## 2022-05-06 MED ORDER — TECHNETIUM TC 99M TETROFOSMIN IV KIT
8.6000 | PACK | Freq: Once | INTRAVENOUS | Status: AC | PRN
  Administered 2022-05-06: 8.6 via INTRAVENOUS

## 2022-05-06 MED ORDER — TECHNETIUM TC 99M TETROFOSMIN IV KIT
28.7000 | PACK | Freq: Once | INTRAVENOUS | Status: AC | PRN
  Administered 2022-05-06: 28.7 via INTRAVENOUS

## 2022-05-09 ENCOUNTER — Ambulatory Visit (INDEPENDENT_AMBULATORY_CARE_PROVIDER_SITE_OTHER): Payer: 59 | Admitting: Clinical

## 2022-05-09 DIAGNOSIS — F17200 Nicotine dependence, unspecified, uncomplicated: Secondary | ICD-10-CM

## 2022-05-09 DIAGNOSIS — F1020 Alcohol dependence, uncomplicated: Secondary | ICD-10-CM | POA: Diagnosis not present

## 2022-05-09 DIAGNOSIS — F4310 Post-traumatic stress disorder, unspecified: Secondary | ICD-10-CM | POA: Diagnosis not present

## 2022-05-09 DIAGNOSIS — F331 Major depressive disorder, recurrent, moderate: Secondary | ICD-10-CM

## 2022-05-09 DIAGNOSIS — F419 Anxiety disorder, unspecified: Secondary | ICD-10-CM
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
## 2022-05-12 ENCOUNTER — Other Ambulatory Visit: Payer: Self-pay

## 2022-05-12 ENCOUNTER — Emergency Department (HOSPITAL_COMMUNITY)
Admission: EM | Admit: 2022-05-12 | Discharge: 2022-05-13 | Disposition: A | Discharge status: Home / Self Care | Payer: 59 | Attending: Emergency Medicine | Admitting: Emergency Medicine

## 2022-05-12 DIAGNOSIS — R1084 Generalized abdominal pain: Secondary | ICD-10-CM

## 2022-05-12 DIAGNOSIS — F17210 Nicotine dependence, cigarettes, uncomplicated: Secondary | ICD-10-CM | POA: Insufficient documentation

## 2022-05-12 DIAGNOSIS — D72829 Elevated white blood cell count, unspecified: Secondary | ICD-10-CM | POA: Diagnosis not present

## 2022-05-12 DIAGNOSIS — I10 Essential (primary) hypertension: Secondary | ICD-10-CM | POA: Diagnosis not present

## 2022-05-12 DIAGNOSIS — R112 Nausea with vomiting, unspecified: Secondary | ICD-10-CM | POA: Diagnosis not present

## 2022-05-12 LAB — CBC
HCT: 41 % (ref 39.0–52.0)
Hemoglobin: 14 g/dL (ref 13.0–17.0)
MCH: 31.4 pg (ref 26.0–34.0)
MCHC: 34.1 g/dL (ref 30.0–36.0)
MCV: 91.9 fL (ref 80.0–100.0)
Platelets: 254 10*3/uL (ref 150–400)
RBC: 4.46 MIL/uL (ref 4.22–5.81)
RDW: 13.5 % (ref 11.5–15.5)
WBC: 18.8 10*3/uL — ABNORMAL HIGH (ref 4.0–10.5)
nRBC: 0 % (ref 0.0–0.2)

## 2022-05-12 NOTE — ED Triage Notes (Signed)
Pt c/o generalized abdominal pain, N&V that started a few hours ago. Pain worse with palpation

## 2022-05-12 NOTE — ED Notes (Signed)
Pt given urinal at bedside.

## 2022-05-13 ENCOUNTER — Emergency Department (HOSPITAL_COMMUNITY): Payer: 59

## 2022-05-13 LAB — COMPREHENSIVE METABOLIC PANEL
ALT: 42 U/L (ref 0–44)
AST: 46 U/L — ABNORMAL HIGH (ref 15–41)
Albumin: 4.4 g/dL (ref 3.5–5.0)
Alkaline Phosphatase: 57 U/L (ref 38–126)
Anion gap: 11 (ref 5–15)
BUN: 10 mg/dL (ref 6–20)
CO2: 25 mmol/L (ref 22–32)
Calcium: 9.2 mg/dL (ref 8.9–10.3)
Chloride: 95 mmol/L — ABNORMAL LOW (ref 98–111)
Creatinine, Ser: 0.8 mg/dL (ref 0.61–1.24)
GFR, Estimated: >60 mL/min (ref >60)
Glucose, Bld: 119 mg/dL — ABNORMAL HIGH (ref 70–99)
Potassium: 3.5 mmol/L (ref 3.5–5.1)
Sodium: 131 mmol/L — ABNORMAL LOW (ref 135–145)
Total Bilirubin: 0.7 mg/dL (ref 0.3–1.2)
Total Protein: 7.2 g/dL (ref 6.5–8.1)

## 2022-05-13 LAB — LIPASE, BLOOD: Lipase: 52 U/L — ABNORMAL HIGH (ref 11–51)

## 2022-05-13 MED ORDER — IOHEXOL 300 MG/ML  SOLN
100.0000 mL | Freq: Once | INTRAMUSCULAR | Status: AC | PRN
  Administered 2022-05-13: 100 mL via INTRAVENOUS

## 2022-05-13 MED ORDER — ONDANSETRON 4 MG PO TBDP: 4.0000 mg | ORAL_TABLET | Freq: Three times a day (TID) | ORAL | 0 refills | Status: DC | PRN

## 2022-05-13 MED ORDER — ONDANSETRON 4 MG PO TBDP: 4.0000 mg | ORAL_TABLET | Freq: Three times a day (TID) | ORAL | 0 refills | Status: AC | PRN

## 2022-05-13 MED ORDER — LOPERAMIDE HCL 2 MG PO CAPS: 2.0000 mg | ORAL_CAPSULE | Freq: Four times a day (QID) | ORAL | 0 refills | Status: AC | PRN

## 2022-05-13 MED ORDER — DICYCLOMINE HCL 20 MG PO TABS: 20.0000 mg | ORAL_TABLET | Freq: Two times a day (BID) | ORAL | 0 refills | Status: AC

## 2022-05-13 MED ORDER — DICYCLOMINE HCL 20 MG PO TABS: 20.0000 mg | ORAL_TABLET | Freq: Two times a day (BID) | ORAL | 0 refills | Status: DC

## 2022-05-13 MED ORDER — LOPERAMIDE HCL 2 MG PO CAPS: 2.0000 mg | ORAL_CAPSULE | Freq: Four times a day (QID) | ORAL | 0 refills | Status: DC | PRN

## 2022-05-13 MED ORDER — SODIUM CHLORIDE 0.9 % IV BOLUS
1000.0000 mL | Freq: Once | INTRAVENOUS | Status: AC
  Administered 2022-05-13: 1000 mL via INTRAVENOUS

## 2022-05-13 NOTE — ED Notes (Signed)
Patient transported to CT 

## 2022-05-13 NOTE — Discharge Instructions (Signed)
You were evaluated in the Emergency Department and after careful evaluation, we did not find any emergent condition requiring admission or further testing in the hospital.  Your exam/testing today is overall reassuring.  Symptoms likely due to a stomach bug or food poisoning.  Use the Zofran as needed for nausea, use the Bentyl medication as needed for crampy abdominal discomfort.  Use the Imodium as needed for diarrhea.  Please return to the Emergency Department if you experience any worsening of your condition.   Thank you for allowing [place] to be a part of your care.

## 2022-05-13 NOTE — ED Notes (Signed)
ED Provider at bedside. 

## 2022-05-13 NOTE — ED Provider Notes (Signed)
[facility] Emergency Department Provider Note MRN:  [id number]  Arrival date & time: 05/13/22     Chief Complaint   Abdominal Pain   History of Present Illness   Sean Jordan is a 52 y.o. year-old male with a history of hypertension presenting to the ED with chief complaint of abdominal pain.  Nausea vomiting and generalized abdominal cramping pain since eating [place] for dinner.  No fever, no diarrhea.  Review of Systems  A thorough review of systems was obtained and all systems are negative except as noted in the HPI and PMH.   Patient's Health History    Past Medical History:  Diagnosis Date   Depression    Hypertension     Past Surgical History:  Procedure Laterality Date   ABDOMINAL SURGERY     ELBOW FRACTURE SURGERY      Family History  Problem Relation Age of Onset   Healthy Mother    Healthy Father     Social History   Socioeconomic History   Marital status: Divorced    Spouse name: Not on file   Number of children: Not on file   Years of education: Not on file   Highest education level: Not on file  Occupational History   Not on file  Tobacco Use   Smoking status: Every Day    Packs/day: 1.00    Types: Cigarettes   Smokeless tobacco: Never  Substance and Sexual Activity   Alcohol use: Yes    Comment: occ   Drug use: Not on file   Sexual activity: Not on file  Other Topics Concern   Not on file  Social History Narrative   Not on file   Social Determinants of Health   Financial Resource Strain: Not on file  Food Insecurity: Not on file  Transportation Needs: Not on file  Physical Activity: Not on file  Stress: Not on file  Social Connections: Not on file  Intimate Partner Violence: Not on file     Physical Exam   Vitals:   05/13/22 0030 05/13/22 0100  BP: (!) 153/87 (!) 156/91  Pulse: 96 97  Resp:  17  Temp:    SpO2: 97% 97%    CONSTITUTIONAL: Well-appearing, NAD NEURO/PSYCH:  Alert and  oriented x 3, no focal deficits EYES:  eyes equal and reactive ENT/NECK:  no LAD, no JVD CARDIO: Regular rate, well-perfused, normal S1 and S2 PULM:  CTAB no wheezing or rhonchi GI/GU:  non-distended, non-tender MSK/SPINE:  No gross deformities, no edema SKIN:  no rash, atraumatic   *Additional and/or pertinent findings included in MDM below  Diagnostic and Interventional Summary    EKG Interpretation  Date/Time:    Ventricular Rate:    PR Interval:    QRS Duration:   QT Interval:    QTC Calculation:   R Axis:     Text Interpretation:         Labs Reviewed  LIPASE, BLOOD - Abnormal; Notable for the following components:      Result Value   Lipase 52 (*)    All other components within normal limits  COMPREHENSIVE METABOLIC PANEL - Abnormal; Notable for the following components:   Sodium 131 (*)    Chloride 95 (*)    Glucose, Bld 119 (*)    AST 46 (*)    All other components within normal limits  CBC - Abnormal; Notable for the following components:   WBC 18.8 (*)    All  other components within normal limits    CT ABDOMEN PELVIS W CONTRAST  Final Result      Medications  sodium chloride 0.9 % bolus 1,000 mL (1,000 mLs Intravenous New Bag/Given 05/13/22 0029)  iohexol (OMNIPAQUE) 300 MG/ML solution 100 mL (100 mLs Intravenous Contrast Given 05/13/22 0039)     Procedures  /  Critical Care Procedures  ED Course and Medical Decision Making  Initial Impression and Ddx Vital signs reassuring, abdomen soft and nontender.  Multiple episodes of vomiting.  Complex surgical abdominal history, history of ex lap.  Not passing any gas for the past 5 hours.  Will obtain CT to exclude SBO.  Favoring gastroenteritis.  Past medical/surgical history that increases complexity of ED encounter: Surgical abdominal history.  Interpretation of Diagnostics I personally reviewed the laboratory assessment and my interpretation is as follows: Leukocytosis, otherwise no significant blood  count or electrolyte disturbance  CT without emergent process, evidence of enteritis.  Patient Reassessment and Ultimate Disposition/Management     Tolerating p.o., feels well, appropriate for discharge.  Patient management required discussion with the following services or consulting groups:  None  Complexity of Problems Addressed Acute illness or injury that poses threat of life of bodily function  Additional Data Reviewed and Analyzed Further history obtained from: Further history from spouse/family member  Additional Factors Impacting ED Encounter Risk Prescriptions  Barth Kirks. Sedonia Small, MD [place] [email or web address]  Final Clinical Impressions(s) / ED Diagnoses     ICD-10-CM   1. Generalized abdominal pain  R10.84     2. Nausea and vomiting, unspecified vomiting type  R11.2       ED Discharge Orders          Ordered    ondansetron (ZOFRAN-ODT) 4 MG disintegrating tablet  Every 8 hours PRN        05/13/22 0143    dicyclomine (BENTYL) 20 MG tablet  2 times daily        05/13/22 0143    loperamide (IMODIUM) 2 MG capsule  4 times daily PRN        05/13/22 0143             Discharge Instructions Discussed with and Provided to Patient:    Discharge Instructions      You were evaluated in the Emergency Department and after careful evaluation, we did not find any emergent condition requiring admission or further testing in the hospital.  Your exam/testing today is overall reassuring.  Symptoms likely due to a stomach bug or food poisoning.  Use the Zofran as needed for nausea, use the Bentyl medication as needed for crampy abdominal discomfort.  Use the Imodium as needed for diarrhea.  Please return to the Emergency Department if you experience any worsening of your condition.   Thank you for allowing [place] to be a part of your care.      Maudie Flakes, MD 05/13/22 [phone]

## 2022-06-27 ENCOUNTER — Ambulatory Visit (INDEPENDENT_AMBULATORY_CARE_PROVIDER_SITE_OTHER): Payer: 59 | Admitting: Clinical

## 2022-06-27 DIAGNOSIS — F1020 Alcohol dependence, uncomplicated: Secondary | ICD-10-CM | POA: Diagnosis not present

## 2022-06-27 DIAGNOSIS — F419 Anxiety disorder, unspecified: Secondary | ICD-10-CM

## 2022-06-27 DIAGNOSIS — F17200 Nicotine dependence, unspecified, uncomplicated: Secondary | ICD-10-CM

## 2022-06-27 DIAGNOSIS — F331 Major depressive disorder, recurrent, moderate: Secondary | ICD-10-CM

## 2022-06-27 DIAGNOSIS — F4310 Post-traumatic stress disorder, unspecified: Secondary | ICD-10-CM | POA: Diagnosis not present
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
## 2022-08-08 ENCOUNTER — Ambulatory Visit (INDEPENDENT_AMBULATORY_CARE_PROVIDER_SITE_OTHER): Payer: 59 | Admitting: Clinical

## 2022-08-08 DIAGNOSIS — F4310 Post-traumatic stress disorder, unspecified: Secondary | ICD-10-CM

## 2022-08-08 DIAGNOSIS — F17200 Nicotine dependence, unspecified, uncomplicated: Secondary | ICD-10-CM | POA: Diagnosis not present

## 2022-08-08 DIAGNOSIS — F331 Major depressive disorder, recurrent, moderate: Secondary | ICD-10-CM

## 2022-08-08 DIAGNOSIS — F419 Anxiety disorder, unspecified: Secondary | ICD-10-CM

## 2022-08-08 DIAGNOSIS — F1020 Alcohol dependence, uncomplicated: Secondary | ICD-10-CM
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
## 2022-09-19 ENCOUNTER — Ambulatory Visit (INDEPENDENT_AMBULATORY_CARE_PROVIDER_SITE_OTHER): Payer: 59 | Admitting: Clinical

## 2022-09-19 DIAGNOSIS — F1020 Alcohol dependence, uncomplicated: Secondary | ICD-10-CM

## 2022-09-19 DIAGNOSIS — F419 Anxiety disorder, unspecified: Secondary | ICD-10-CM

## 2022-09-19 DIAGNOSIS — F17200 Nicotine dependence, unspecified, uncomplicated: Secondary | ICD-10-CM

## 2022-09-19 DIAGNOSIS — F4310 Post-traumatic stress disorder, unspecified: Secondary | ICD-10-CM

## 2022-09-19 DIAGNOSIS — F331 Major depressive disorder, recurrent, moderate: Secondary | ICD-10-CM
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
## 2022-09-30 ENCOUNTER — Encounter (HOSPITAL_COMMUNITY): Payer: Self-pay | Admitting: Internal Medicine

## 2022-09-30 ENCOUNTER — Other Ambulatory Visit (HOSPITAL_COMMUNITY): Payer: Self-pay | Admitting: Internal Medicine

## 2022-09-30 DIAGNOSIS — R911 Solitary pulmonary nodule: Secondary | ICD-10-CM

## 2022-10-03 ENCOUNTER — Ambulatory Visit: Payer: 59 | Admitting: Internal Medicine

## 2022-10-31 ENCOUNTER — Ambulatory Visit (HOSPITAL_COMMUNITY): Payer: 59 | Admitting: Clinical

## 2022-12-05 ENCOUNTER — Ambulatory Visit (INDEPENDENT_AMBULATORY_CARE_PROVIDER_SITE_OTHER): Payer: 59 | Admitting: Clinical

## 2022-12-05 DIAGNOSIS — F1020 Alcohol dependence, uncomplicated: Secondary | ICD-10-CM | POA: Diagnosis not present

## 2022-12-05 DIAGNOSIS — F4310 Post-traumatic stress disorder, unspecified: Secondary | ICD-10-CM | POA: Diagnosis not present

## 2022-12-05 DIAGNOSIS — F419 Anxiety disorder, unspecified: Secondary | ICD-10-CM

## 2022-12-05 DIAGNOSIS — F331 Major depressive disorder, recurrent, moderate: Secondary | ICD-10-CM

## 2022-12-05 DIAGNOSIS — F17200 Nicotine dependence, unspecified, uncomplicated: Secondary | ICD-10-CM | POA: Diagnosis not present
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
## 2023-01-23 ENCOUNTER — Ambulatory Visit (HOSPITAL_COMMUNITY): Payer: 59 | Admitting: Clinical

## 2023-01-23 DIAGNOSIS — F1020 Alcohol dependence, uncomplicated: Secondary | ICD-10-CM | POA: Diagnosis not present

## 2023-01-23 DIAGNOSIS — F17200 Nicotine dependence, unspecified, uncomplicated: Secondary | ICD-10-CM | POA: Diagnosis not present

## 2023-01-23 DIAGNOSIS — F4310 Post-traumatic stress disorder, unspecified: Secondary | ICD-10-CM

## 2023-01-23 DIAGNOSIS — F331 Major depressive disorder, recurrent, moderate: Secondary | ICD-10-CM | POA: Diagnosis not present

## 2023-01-23 DIAGNOSIS — F419 Anxiety disorder, unspecified: Secondary | ICD-10-CM

## 2023-01-23 NOTE — Progress Notes (Signed)
IN PERSON   I connected with Sean Jordan on 01/23/23 at  2:00 PM EST in person and verified that I am speaking with the correct person using two identifiers.   Location: Patient: Office Provider: Office    I discussed the limitations of evaluation and management by telemedicine and the availability of in person appointments. The patient expressed understanding and agreed to proceed. (IN PERSON)   THERAPIST PROGRESS NOTE   Session Time: 2:00 PM-2:55 PM   Participation Level: Active   Behavioral Response: CasualAlertDepressed   Type of Therapy: Individual Therapy   Treatment Goals addressed: Coping   Interventions: CBT and Strength-based   Summary: Sean Jordan  is a 52 y.o. male who presents with Depression with Anxiety, PTSD, Alcohol and Tobacco Use Disorder. The OPT therapist worked with the patient for his ongoing OPT treatment. The OPT therapist utilized Motivational Interviewing to assist in creating therapeutic repore. The patient in the session was engaged and work in collaboration giving feedback about his triggers and symptoms over the past few weeks.The patient spoke about his largest stressors being his work related stress and conflict within his relationship. The OPT therapist worked with the patient examining both in session. .  The patient spoke about his current primary focus of changing jobs and noted he has updated resume and continued to apply with his new resume and changed his expectations around position and pay just to help him to change jobs.The OPT therapist utilized Cognitive Behavioral Therapy through cognitive restructuring as well as worked with the patient on coping strategies to assist in management of his mental health symptoms. The OPT therapist continued working with the patient using organization tools helpful in keeping his focus on one task at a time through competition. Additionally the OPT therapist worked with the patient in this  session on a plan to increase physical exercise and decrease smoking.     Suicidal/Homicidal: Nowithout intent/plan   Therapist Response: The OPT therapist worked with the patient for the patients scheduled session. The patient was engaged in his session and gave feedback in relation to triggers, symptoms, and behavior responses over the past few weeks.The OPT therapist worked with the patient utilizing an in session Cognitive Behavioral Therapy exercise. The patient has been implementing coping to deal with MH symptoms and spoke about working on his time management and continued work to find a new job through [employer], adult education his resume and applying for a larger range of positions/ opportunities.  The OPT therapist worked with the patient on season specific coping skills, time management, and work on smoking cessation .The patient will continue to work with his PCP, Cardiologist and other health providers .The OP therapist worked with the patient on coping skills for stress management .The OPT therapist will continue treatment work with the patient in his next scheduled session.   Plan: Return again in 2/3 weeks.   Diagnosis:      Axis I: Recurrent moderate MDD with anxiety, PTSD, Alcohol and Tobacco Use Disorder                            Axis II: No diagnosis   Collaboration of Care: No additional collaboration of care for this session.     Patient/Guardian was advised Release of Information must be obtained prior to any record release in order to collaborate their care with an outside provider. Patient/Guardian was advised if they have not already done so to contact the  registration department to sign all necessary forms in order for [place] to release information regarding their care.    Consent: Patient/Guardian gives verbal consent for treatment and assignment of benefits for services provided during this visit. Patient/Guardian expressed understanding and agreed to proceed     I discussed the  assessment and treatment plan with the patient. The patient was provided an opportunity to ask questions and all were answered. The patient agreed with the plan and demonstrated an understanding of the instructions.   The patient was advised to call back or seek an in-person evaluation if the symptoms worsen or if the condition fails to improve as anticipated.   I provided 55 minutes of face-to-face time during this encounter.   Winfred Burn, LCSW   01/23/2023

## 2023-03-20 ENCOUNTER — Ambulatory Visit (INDEPENDENT_AMBULATORY_CARE_PROVIDER_SITE_OTHER): Payer: 59 | Admitting: Clinical

## 2023-03-20 DIAGNOSIS — F419 Anxiety disorder, unspecified: Secondary | ICD-10-CM

## 2023-03-20 DIAGNOSIS — F17200 Nicotine dependence, unspecified, uncomplicated: Secondary | ICD-10-CM | POA: Diagnosis not present

## 2023-03-20 DIAGNOSIS — F331 Major depressive disorder, recurrent, moderate: Secondary | ICD-10-CM | POA: Diagnosis not present

## 2023-03-20 DIAGNOSIS — F4310 Post-traumatic stress disorder, unspecified: Secondary | ICD-10-CM | POA: Diagnosis not present

## 2023-03-20 DIAGNOSIS — F1020 Alcohol dependence, uncomplicated: Secondary | ICD-10-CM

## 2023-03-20 NOTE — Progress Notes (Signed)
IN PERSON   I connected with Sean Jordan on 03/20/23 at  3:00 PM EST in person and verified that I am speaking with the correct person using two identifiers.   Location: Patient: Office Provider: Office    I discussed the limitations of evaluation and management by telemedicine and the availability of in person appointments. The patient expressed understanding and agreed to proceed. (IN PERSON)   THERAPIST PROGRESS NOTE   Session Time: 3:00 PM-3:45 PM   Participation Level: Active   Behavioral Response: CasualAlertDepressed   Type of Therapy: Individual Therapy   Treatment Goals addressed: Coping   Interventions: CBT and Strength-based   Summary: Sean Jordan  is a 53 y.o. male who presents with Depression with Anxiety, PTSD, Alcohol and Tobacco Use Disorder. The OPT therapist worked with the patient for his ongoing OPT treatment. The OPT therapist utilized Motivational Interviewing to assist in creating therapeutic repore. The patient in the session was engaged and work in collaboration giving feedback about his triggers and symptoms over the past few weeks.The patient spoke about his largest stressors being his work related stress and conflict within his relationship. The OPT therapist worked with the patient examining both in session. .  The patient spoke about his current primary focus of changing jobs and noted he has continued to apply with potential employers in the hope of being able to get on with a employer and change into a position that is a better fit for him.The OPT therapist utilized Cognitive Behavioral Therapy through cognitive restructuring as well as worked with the patient on coping strategies to assist in management of his mental health symptoms. The OPT therapist continued working with the patient using organization tools helpful in keeping his focus on one task at a time through competition. The patient in this session spoke about his efforts to  decrease smoking and noted he is making substantial progress in smoking cessation.   Suicidal/Homicidal: Nowithout intent/plan   Therapist Response: The OPT therapist worked with the patient for the patients scheduled session. The patient was engaged in his session and gave feedback in relation to triggers, symptoms, and behavior responses over the past few weeks.The OPT therapist worked with the patient utilizing an in session Cognitive Behavioral Therapy exercise. The patient has been implementing coping to deal with MH symptoms and spoke about working on his time management and continued work to find a new job through  [employer].  The OPT therapist worked with the patient on season specific coping skills, time management, and work on smoking cessation .The patient will continue to work with his PCP, Cardiologist and other health providers .The OP therapist worked with the patient on coping skills for stress management  The patient spoke about ongoing interactions in his relationship and the impact carryover from his current job related stress. The OPT therapist worked with the patient on creating a better work/life balance.The OPT therapist will continue treatment work with the patient in his next scheduled session.   Plan: Return again in 2/3 weeks.   Diagnosis:      Axis I: Recurrent moderate MDD with anxiety, PTSD, Alcohol and Tobacco Use Disorder                            Axis II: No diagnosis   Collaboration of Care: No additional collaboration of care for this session.     Patient/Guardian was advised Release of Information must be obtained prior  to any record release in order to collaborate their care with an outside provider. Patient/Guardian was advised if they have not already done so to contact the registration department to sign all necessary forms in order for [place] to release information regarding their care.    Consent: Patient/Guardian gives verbal consent for treatment and  assignment of benefits for services provided during this visit. Patient/Guardian expressed understanding and agreed to proceed     I discussed the assessment and treatment plan with the patient. The patient was provided an opportunity to ask questions and all were answered. The patient agreed with the plan and demonstrated an understanding of the instructions.   The patient was advised to call back or seek an in-person evaluation if the symptoms worsen or if the condition fails to improve as anticipated.   I provided 45 minutes of face-to-face time during this encounter.   Winfred Burn, LCSW   03/20/2023

## 2023-04-10 ENCOUNTER — Other Ambulatory Visit: Payer: Self-pay | Admitting: Internal Medicine

## 2023-04-10 DIAGNOSIS — I10 Essential (primary) hypertension: Secondary | ICD-10-CM

## 2023-05-01 ENCOUNTER — Ambulatory Visit (INDEPENDENT_AMBULATORY_CARE_PROVIDER_SITE_OTHER): Payer: 59 | Admitting: Clinical

## 2023-05-01 DIAGNOSIS — F419 Anxiety disorder, unspecified: Secondary | ICD-10-CM

## 2023-05-01 DIAGNOSIS — F4310 Post-traumatic stress disorder, unspecified: Secondary | ICD-10-CM | POA: Diagnosis not present

## 2023-05-01 DIAGNOSIS — F17200 Nicotine dependence, unspecified, uncomplicated: Secondary | ICD-10-CM

## 2023-05-01 DIAGNOSIS — F1020 Alcohol dependence, uncomplicated: Secondary | ICD-10-CM

## 2023-05-01 DIAGNOSIS — F331 Major depressive disorder, recurrent, moderate: Secondary | ICD-10-CM

## 2023-05-01 NOTE — Progress Notes (Signed)
 IN PERSON   I connected with Sean Jordan on 03/20/23 at  3:00 PM EST in person and verified that I am speaking with the correct person using two identifiers.   Location: Patient: Office Provider: Office    I discussed the limitations of evaluation and management by telemedicine and the availability of in person appointments. The patient expressed understanding and agreed to proceed. (IN PERSON)   THERAPIST PROGRESS NOTE   Session Time: 3:00 PM-3:45 PM   Participation Level: Active   Behavioral Response: CasualAlertDepressed   Type of Therapy: Individual Therapy   Treatment Goals addressed: Coping   Interventions: CBT and Strength-based   Summary: Sean Jordan  is a 53 y.o. male who presents with Depression with Anxiety, PTSD, Alcohol and Tobacco Use Disorder. The OPT therapist worked with the patient for his ongoing OPT treatment. The OPT therapist utilized Motivational Interviewing to assist in creating therapeutic repore. The patient in the session was engaged and work in collaboration giving feedback about his triggers and symptoms over the past few weeks.The patient spoke about his largest stressors being his work related stress and conflict within his relationship and recently trying to work out finances to buy family farm land. The OPT therapist worked with the patient examining both in session. .  The patient spoke about his current primary focus of changing jobs and noted he has continued to apply with potential employers in the hope of being able to get on with a employer and change into a position that is a better fit for him.The OPT therapist utilized Cognitive Behavioral Therapy through cognitive restructuring as well as worked with the patient on coping strategies to assist in management of his mental health symptoms. The OPT therapist continued working with the patient using organization tools helpful in keeping his focus on one task at a time through  competition. The patient in this session spoke about his efforts to decrease smoking. The patient worked with the OPT therapist utilizing the in my control/ not in my control tool to assist with management of stressors.   Suicidal/Homicidal: Nowithout intent/plan   Therapist Response: The OPT therapist worked with the patient for the patients scheduled session. The patient was engaged in his session and gave feedback in relation to triggers, symptoms, and behavior responses over the past few weeks.The OPT therapist worked with the patient utilizing an in session Cognitive Behavioral Therapy exercise. The patient has been implementing coping to deal with MH symptoms and spoke about working on his time management and continued work to find a new job through [employer] and spoke about planning for this weekend to revamp his resume.  The OPT therapist worked with the patient on season specific coping skills, time management, and work on smoking cessation .The patient will continue to work with his PCP, Cardiologist and other health providers .The OP therapist worked with the patient on coping skills for stress management  The patient spoke about ongoing interactions in his relationship and the impact carryover from his current job related stress and working on finances to buy family farm land. The patient spoke about having a deadline of tomorrow to be able to talk with the seller another family member and come to terms before they place the land up for sale for online buyers to purchase. The OPT therapist worked with the patient on creating a better work/life balance.The OPT therapist will continue treatment work with the patient in his next scheduled session.   Plan: Return again in  2/3 weeks.   Diagnosis:      Axis I: Recurrent moderate MDD with anxiety, PTSD, Alcohol and Tobacco Use Disorder                            Axis II: No diagnosis   Collaboration of Care: No additional collaboration of care  for this session.     Patient/Guardian was advised Release of Information must be obtained prior to any record release in order to collaborate their care with an outside provider. Patient/Guardian was advised if they have not already done so to contact the registration department to sign all necessary forms in order for [place] to release information regarding their care.    Consent: Patient/Guardian gives verbal consent for treatment and assignment of benefits for services provided during this visit. Patient/Guardian expressed understanding and agreed to proceed     I discussed the assessment and treatment plan with the patient. The patient was provided an opportunity to ask questions and all were answered. The patient agreed with the plan and demonstrated an understanding of the instructions.   The patient was advised to call back or seek an in-person evaluation if the symptoms worsen or if the condition fails to improve as anticipated.   I provided 45 minutes of face-to-face time during this encounter.   Winfred Burn, LCSW   05/01/2023

## 2023-05-02 ENCOUNTER — Encounter (HOSPITAL_COMMUNITY): Payer: Self-pay | Admitting: Internal Medicine

## 2023-05-02 ENCOUNTER — Other Ambulatory Visit (HOSPITAL_COMMUNITY): Payer: Self-pay | Admitting: Internal Medicine

## 2023-05-02 DIAGNOSIS — R911 Solitary pulmonary nodule: Secondary | ICD-10-CM

## 2023-05-10 ENCOUNTER — Other Ambulatory Visit: Payer: Self-pay | Admitting: Internal Medicine

## 2023-05-10 DIAGNOSIS — I10 Essential (primary) hypertension: Secondary | ICD-10-CM

## 2023-05-26 ENCOUNTER — Other Ambulatory Visit: Payer: Self-pay | Admitting: Internal Medicine

## 2023-05-26 DIAGNOSIS — I10 Essential (primary) hypertension: Secondary | ICD-10-CM

## 2023-06-12 ENCOUNTER — Ambulatory Visit (INDEPENDENT_AMBULATORY_CARE_PROVIDER_SITE_OTHER): Payer: 59 | Admitting: Clinical

## 2023-06-12 DIAGNOSIS — F331 Major depressive disorder, recurrent, moderate: Secondary | ICD-10-CM

## 2023-06-12 DIAGNOSIS — F419 Anxiety disorder, unspecified: Secondary | ICD-10-CM

## 2023-06-12 DIAGNOSIS — F17200 Nicotine dependence, unspecified, uncomplicated: Secondary | ICD-10-CM

## 2023-06-12 DIAGNOSIS — F4310 Post-traumatic stress disorder, unspecified: Secondary | ICD-10-CM | POA: Diagnosis not present

## 2023-06-12 DIAGNOSIS — F1020 Alcohol dependence, uncomplicated: Secondary | ICD-10-CM | POA: Diagnosis not present

## 2023-06-12 NOTE — Progress Notes (Signed)
 IN PERSON   I connected with Sean Jordan on 06/12/23 at  2:00 PM EST in person and verified that I am speaking with the correct person using two identifiers.   Location: Patient: Office Provider: Office    I discussed the limitations of evaluation and management by telemedicine and the availability of in person appointments. The patient expressed understanding and agreed to proceed. (IN PERSON)   THERAPIST PROGRESS NOTE   Session Time: 2:00 PM-2:45 PM   Participation Level: Active   Behavioral Response: CasualAlertDepressed   Type of Therapy: Individual Therapy   Treatment Goals addressed: Coping   Interventions: CBT and Strength-based   Summary: Sean Jordan  is a 53 y.o. male who presents with Depression with Anxiety, PTSD, Alcohol and Tobacco Use Disorder. The OPT therapist worked with the patient for his ongoing OPT treatment. The OPT therapist utilized Motivational Interviewing to assist in creating therapeutic repore. The patient in the session was engaged and work in collaboration giving feedback about his triggers and symptoms over the past few weeks.The patient spoke about his largest stressors being his work related stress and conflict within his relationship . The patient noted no longer having stress around buying the family farm land as he was unable to make a deal with the family members who are selling and they have placed the land on the market at a value higher than the patient was willing to buy it.The patient spoke about his current primary focus of changing jobs and noted his goal of getting back to continuing to apply with potential employers in the hope of being able to get on with a employer and change into a position that is a better fit for him.The OPT therapist utilized Cognitive Behavioral Therapy through cognitive restructuring as well as worked with the patient on coping strategies to assist in management of his mental health symptoms. The OPT  therapist continued working with the patient using organization tools helpful in keeping his focus on one task at a time through competition. The patient in this session spoke about now with less stress due to the farm situation being resolved his goal is to get back to  his efforts to decrease smoking. The patient worked with the OPT therapist utilizing the in my control/ not in my control tool to assist with management of stressors. The patient spoke about upcoming events through April he is currently looking forward to.   Suicidal/Homicidal: Nowithout intent/plan   Therapist Response: The OPT therapist worked with the patient for the patients scheduled session. The patient was engaged in his session and gave feedback in relation to triggers, symptoms, and behavior responses over the past few weeks.The OPT therapist worked with the patient utilizing an in session Cognitive Behavioral Therapy exercise. The patient has been implementing coping to deal with MH symptoms and spoke about working on his time management. 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  The OPT therapist worked with the patient on season specific coping skills, time management, and work on smoking cessation .The patient will continue to work with his PCP, Cardiologist and other health providers .The OP therapist worked with the patient on coping skills for stress management  The OPT therapist worked with the patient on creating a better work/life balance and utilizing Spring season coping  skills to manage his mood and anxiety..The OPT therapist will continue treatment work with the patient in his next scheduled session.   Plan: Return again in 2/3 weeks.   Diagnosis:      Axis I: Recurrent moderate MDD with anxiety, PTSD, Alcohol and Tobacco Use  Disorder                            Axis II: No diagnosis   Collaboration of Care: No additional collaboration of care for this session.     Patient/Guardian was advised Release of Information must be obtained prior to any record release in order to collaborate their care with an outside provider. Patient/Guardian was advised if they have not already done so to contact the registration department to sign all necessary forms in order for [place] to release information regarding their care.    Consent: Patient/Guardian gives verbal consent for treatment and assignment of benefits for services provided during this visit. Patient/Guardian expressed understanding and agreed to proceed     I discussed the assessment and treatment plan with the patient. The patient was provided an opportunity to ask questions and all were answered. The patient agreed with the plan and demonstrated an understanding of the instructions.   The patient was advised to call back or seek an in-person evaluation if the symptoms worsen or if the condition fails to improve as anticipated.   I provided 30 minutes of face-to-face time during this encounter.   Winfred Burn, LCSW   06/12/2023

## 2023-06-14 ENCOUNTER — Other Ambulatory Visit: Payer: Self-pay | Admitting: Internal Medicine

## 2023-06-14 DIAGNOSIS — I10 Essential (primary) hypertension: Secondary | ICD-10-CM

## 2023-06-25 ENCOUNTER — Other Ambulatory Visit: Payer: Self-pay | Admitting: Internal Medicine

## 2023-06-25 DIAGNOSIS — I10 Essential (primary) hypertension: Secondary | ICD-10-CM

## 2023-07-04 ENCOUNTER — Ambulatory Visit (HOSPITAL_COMMUNITY): Payer: 59

## 2023-07-17 ENCOUNTER — Ambulatory Visit (INDEPENDENT_AMBULATORY_CARE_PROVIDER_SITE_OTHER): Admitting: Clinical

## 2023-07-17 DIAGNOSIS — F4310 Post-traumatic stress disorder, unspecified: Secondary | ICD-10-CM

## 2023-07-17 DIAGNOSIS — F1020 Alcohol dependence, uncomplicated: Secondary | ICD-10-CM

## 2023-07-17 DIAGNOSIS — F17200 Nicotine dependence, unspecified, uncomplicated: Secondary | ICD-10-CM

## 2023-07-17 DIAGNOSIS — F331 Major depressive disorder, recurrent, moderate: Secondary | ICD-10-CM

## 2023-07-17 DIAGNOSIS — F419 Anxiety disorder, unspecified: Secondary | ICD-10-CM

## 2023-07-17 NOTE — Progress Notes (Signed)
 IN PERSON   I connected with Sean Jordan on 07/17/23 at  2:00 PM EST in person and verified that I am speaking with the correct person using two identifiers.   Location: Patient: Office Provider: Office    I discussed the limitations of evaluation and management by telemedicine and the availability of in person appointments. The patient expressed understanding and agreed to proceed. (IN PERSON)   THERAPIST PROGRESS NOTE   Session Time: 2:00 PM-2:30 PM   Participation Level: Active   Behavioral Response: CasualAlertDepressed   Type of Therapy: Individual Therapy   Treatment Goals addressed: Coping   Interventions: CBT and Strength-based   Summary: Sean Jordan. Sean Jordan  is a 53 y.o. male who presents with Depression with Anxiety, PTSD, Alcohol and Tobacco Use Disorder. The OPT therapist worked with the patient for his ongoing OPT treatment. The OPT therapist utilized Motivational Interviewing to assist in creating therapeutic repore. 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 The OPT therapist continued working with the patient using organization tools helpful in  keeping his focus on one task at a time through competition. The patient in this session spoke about his goal of talking with his PCP to add med therapy to help with focus, attention, and concentration.. The patient worked with the OPT therapist utilizing the in my control/ not in my control tool to assist with management of stressors. The patient spoke about upcoming event at the end of June flying out of state for the upcoming 4th of July holiday   Suicidal/Homicidal: Nowithout intent/plan   Therapist Response: The OPT therapist worked with the patient for the patients scheduled session. The patient was engaged in his session and gave feedback in relation to triggers, symptoms, and behavior responses over the past few weeks.The OPT therapist worked with the patient utilizing an in session Cognitive Behavioral Therapy exercise. The patient has been implementing coping to deal with MH symptoms and spoke about working on his time management. The patient spoke about ongoing frustration over the family farm land .The patient spoke about with less stress his goal of getting back to working on applying for another job..  The OPT therapist worked with the patient on season specific coping skills, time management, and work on smoking cessation .The patient will continue to work with his PCP, Cardiologist and other health providers .The OP therapist worked with the patient on coping skills for stress management  The OPT therapist worked with the patient on creating a better work/life balance and utilizing Spring season coping skills to manage his mood and anxiety. The patient additionally spoke about asking his PCP for med management to help him with attention concentration and  focus.The OPT therapist will continue treatment work with the patient in his next scheduled session.   Plan: Return again in 2/3 weeks.   Diagnosis:      Axis I: Recurrent moderate MDD with anxiety, PTSD, Alcohol and Tobacco Use Disorder                             Axis II: No diagnosis   Collaboration of Care: No additional collaboration of care for this session.     Patient/Guardian was advised Release of Information must be obtained prior to any record release in order to collaborate their care with an outside provider. Patient/Guardian was advised if they have not already done so to contact the registration department to sign all necessary forms in order for us  to release information regarding their care.    Consent: Patient/Guardian gives verbal consent for treatment and assignment of benefits for services provided during this visit. Patient/Guardian expressed understanding and agreed to proceed     I discussed the assessment and treatment plan with the patient. The patient was provided an opportunity to ask questions and all were answered. The patient agreed with the plan and demonstrated an understanding of the instructions.   The patient was advised to call back or seek an in-person evaluation if the symptoms worsen or if the condition fails to improve as anticipated.   I provided 30 minutes of face-to-face time during this encounter.   Lea Primmer, LCSW   07/17/2023

## 2023-08-11 ENCOUNTER — Ambulatory Visit (HOSPITAL_COMMUNITY): Admission: RE | Admit: 2023-08-11 | Source: Ambulatory Visit

## 2023-08-21 ENCOUNTER — Ambulatory Visit (INDEPENDENT_AMBULATORY_CARE_PROVIDER_SITE_OTHER): Admitting: Clinical

## 2023-08-21 ENCOUNTER — Encounter (HOSPITAL_COMMUNITY): Payer: Self-pay

## 2023-08-21 ENCOUNTER — Emergency Department (HOSPITAL_COMMUNITY)

## 2023-08-21 ENCOUNTER — Other Ambulatory Visit: Payer: Self-pay

## 2023-08-21 ENCOUNTER — Emergency Department (HOSPITAL_COMMUNITY)
Admission: EM | Admit: 2023-08-21 | Discharge: 2023-08-21 | Disposition: A | Discharge status: Home / Self Care | Attending: Emergency Medicine | Admitting: Emergency Medicine

## 2023-08-21 DIAGNOSIS — N5089 Other specified disorders of the male genital organs: Secondary | ICD-10-CM | POA: Insufficient documentation

## 2023-08-21 DIAGNOSIS — Z7982 Long term (current) use of aspirin: Secondary | ICD-10-CM | POA: Diagnosis not present

## 2023-08-21 DIAGNOSIS — F419 Anxiety disorder, unspecified: Secondary | ICD-10-CM

## 2023-08-21 DIAGNOSIS — F17200 Nicotine dependence, unspecified, uncomplicated: Secondary | ICD-10-CM

## 2023-08-21 DIAGNOSIS — F331 Major depressive disorder, recurrent, moderate: Secondary | ICD-10-CM | POA: Diagnosis not present

## 2023-08-21 DIAGNOSIS — F4310 Post-traumatic stress disorder, unspecified: Secondary | ICD-10-CM | POA: Diagnosis not present

## 2023-08-21 DIAGNOSIS — F1020 Alcohol dependence, uncomplicated: Secondary | ICD-10-CM

## 2023-08-21 NOTE — ED Triage Notes (Signed)
 Pt reports he has swelling to his scrotum and and it is mildly painful.

## 2023-08-21 NOTE — Progress Notes (Signed)
 IN PERSON   I connected with Sean Jordan on 08/21/23 at  2:00 PM EST in person and verified that I am speaking with the correct person using two identifiers.   Location: Patient: Office Provider: Office    I discussed the limitations of evaluation and management by telemedicine and the availability of in person appointments. The patient expressed understanding and agreed to proceed. (IN PERSON)   THERAPIST PROGRESS NOTE   Session Time: 2:00 PM-2:30 PM   Participation Level: Active   Behavioral Response: CasualAlertDepressed   Type of Therapy: Individual Therapy   Treatment Goals addressed: Coping   Interventions: CBT and Strength-based   Summary: Sean Jordan. Sean Jordan  is a 53 y.o. male who presents with Depression with Anxiety, PTSD, Alcohol and Tobacco Use Disorder. The OPT therapist worked with the patient for his ongoing OPT treatment. The OPT therapist utilized Motivational Interviewing to assist in creating therapeutic repore. 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 The patient spoke about upcoming trip to [place]  to meet his girlfriend over the course of the 4th of July holiday. The patient spoke about his ongoing discontent with work and his ongoing work towards applying for another job. The patient recently through his PCP was prescribed Vyvanse to help him with his attention, concentration, and focus and he will be starting this medication in the next few days. The patient identified a physical health problem with swollen testicles over the past 24hrs causing discomfort and verbalized intent directly after session to go to the ER across the street at [facility] for further evaluation which the OPT therapist agreed  with and encouraged.The OPT therapist utilized Cognitive Behavioral Therapy through cognitive restructuring as well as worked with the patient on coping strategies to assist in management of his mental health symptoms. The OPT therapist continued working with the patient using organization tools helpful in keeping his focus on one task at a time through competition. The patient in this session spoke about his goal of getting things off of his to do list and hoping the newly prescribed medication will be helpful. The patient worked with the OPT therapist utilizing the in my control/ not in my control tool to assist with management of stressors.   Suicidal/Homicidal: Nowithout intent/plan   Therapist Response: The OPT therapist worked with the patient for the patients scheduled session. The patient was engaged in his session and gave feedback in relation to triggers, symptoms, and behavior responses over the past few weeks.The OPT therapist worked with the patient utilizing an in session Cognitive Behavioral Therapy exercise. The patient has been implementing coping to deal with MH symptoms and spoke about working on his time management. 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 as I was officially sold off .The patient spoke about with less stress his goal of getting back to working on applying for another job..The patient through his PCP was prescribed Vyvanse to assist with his attention, concentration, and focus The OPT therapist worked with the patient on season specific coping skills, time management, and  ongoing work on smoking cessation .The patient will continue to work with his PCP, Cardiologist and other health providers .The OP therapist worked with the patient on coping  skills for stress management  The OPT therapist worked with the patient on creating a better work/life balance and utilizing Summer season coping skills to manage his mood and anxiety. The patient  additionally spoke about going on a mini vacation over the course of July to [place] .. The patient identified a physical health problem with swollen testicles over the past 24hrs causing discomfort and verbalized intent directly after session to go to the ER across the street at [facility] for further evaluation which the OPT therapist agreed with and encouraged.The OPT therapist will continue treatment work with the patient in his next scheduled session.   Plan: Return again in 2/3 weeks.   Diagnosis:      Axis I: Recurrent moderate MDD with anxiety, PTSD, Alcohol and Tobacco Use Disorder                            Axis II: No diagnosis   Collaboration of Care: No additional collaboration of care for this session.     Patient/Guardian was advised Release of Information must be obtained prior to any record release in order to collaborate their care with an outside provider. Patient/Guardian was advised if they have not already done so to contact the registration department to sign all necessary forms in order for us  to release information regarding their care.    Consent: Patient/Guardian gives verbal consent for treatment and assignment of benefits for services provided during this visit. Patient/Guardian expressed understanding and agreed to proceed     I discussed the assessment and treatment plan with the patient. The patient was provided an opportunity to ask questions and all were answered. The patient agreed with the plan and demonstrated an understanding of the instructions.   The patient was advised to call back or seek an in-person evaluation if the symptoms worsen or if the condition fails to improve as anticipated.   I provided 30 minutes of face-to-face time during this encounter.   Sean Primmer, LCSW   08/21/2023

## 2023-08-21 NOTE — ED Notes (Signed)
 Patient transported to Ultrasound

## 2023-08-21 NOTE — Discharge Instructions (Addendum)
 You were seen today for scrotal swelling.  Your ultrasound is normal, there is no sign of infection at this time.  You are not having burning with urination or other symptoms to suggest infection.  I would suggest wearing supportive underwear and following up with your PCP and/your urology this does not go away completely.  It is reassuring that has gotten better since it started.  Come back to the ER for any new or worsening symptoms.

## 2023-08-21 NOTE — ED Provider Notes (Signed)
 [place] EMERG[facility] DEPARTMENT AT [facility] Provider Note   CSN: [id number] Arrival date & time: 08/21/23  1438     Patient presents with: Testicle Pain   Sean Jordan is a 53 y.o. male.  Presents the ER today for scrotal swelling.  He noticed it this morning, states is not painful, it is not red, he has no fevers or chills, no urinary symptoms.  He denies any injury.  He has never had this happen before, he states it has gone down but he was alarmed and wanted to be checked out.  He denies any lower extremity swelling or pain.    Testicle Pain       Prior to Admission medications   Medication Sig Start Date End Date Taking? Authorizing Provider  aspirin EC 81 MG tablet Take by mouth daily.    [provider]  atorvastatin (LIPITOR) 40 MG tablet Take 40 mg by mouth daily.    [provider]  buPROPion (WELLBUTRIN SR) 150 MG 12 hr tablet Take 150 mg by mouth 2 (two) times daily.    [provider]  cetirizine (ZYRTEC) 10 MG tablet Take by mouth daily.    [provider]  dicyclomine  (BENTYL ) 20 MG tablet Take 1 tablet (20 mg total) by mouth 2 (two) times daily. 05/13/22   Theadore Ozell HERO, MD  hydrOXYzine (VISTARIL) 25 MG capsule Take 25-50 mg by mouth at bedtime as needed for anxiety or itching.    [provider]  lisinopril  (ZESTRIL ) 40 MG tablet TAKE 1 TABLET BY MOUTH EVERY DAY 06/16/23   Chandrasekhar, Mahesh A, MD  loperamide  (IMODIUM ) 2 MG capsule Take 1 capsule (2 mg total) by mouth 4 (four) times daily as needed for diarrhea or loose stools. 05/13/22   Theadore Ozell HERO, MD  naproxen (NAPROSYN) 500 MG tablet Take 500 mg by mouth daily at 6 (six) AM.    [provider]  Omega-3 Fatty Acids (OMEGA-3 2100 PO) Take by mouth daily at 6 (six) AM.    [provider]  ondansetron  (ZOFRAN -ODT) 4 MG disintegrating tablet Take 1 tablet (4 mg total) by mouth every 8 (eight) hours as needed for nausea or  vomiting. 05/13/22   Theadore Ozell HERO, MD  sertraline (ZOLOFT) 100 MG tablet Take 100 mg by mouth daily.    [provider]  sildenafil (VIAGRA) 100 MG tablet Take 100 mg by mouth as needed for erectile dysfunction.    [provider]    Allergies: Amoxicillin    Review of Systems  Genitourinary:  Positive for testicular pain.    Updated Vital Signs BP (!) 142/87 (BP Location: Right Arm)   Pulse 93   Temp 98.3 F (36.8 C) (Oral)   Resp 16   Ht 5' 10 (1.778 m)   Wt 76.2 kg   SpO2 93%   BMI 24.11 kg/m   Physical Exam Vitals and nursing note reviewed.  Constitutional:      General: He is not in acute distress.    Appearance: He is well-developed.  HENT:     Head: Normocephalic and atraumatic.   Eyes:     Conjunctiva/sclera: Conjunctivae normal.    Cardiovascular:     Rate and Rhythm: Normal rate and regular rhythm.     Heart sounds: No murmur heard. Pulmonary:     Effort: Pulmonary effort is normal. No respiratory distress.     Breath sounds: Normal breath sounds.  Abdominal:     Palpations: Abdomen  is soft.     Tenderness: There is no abdominal tenderness.  Genitourinary:    Penis: Normal.      Comments: GU exam chaperoned by RN, mild scrotal swelling noted with no erythema, no induration or abscess.  No inguinal adenopathy.  Musculoskeletal:        General: No swelling.     Cervical back: Neck supple.   Skin:    General: Skin is warm and dry.     Capillary Refill: Capillary refill takes less than 2 seconds.   Neurological:     General: No focal deficit present.     Mental Status: He is alert and oriented to person, place, and time.   Psychiatric:        Mood and Affect: Mood normal.     (all labs ordered are listed, but only abnormal results are displayed) Labs Reviewed - No data to display  EKG: None  Radiology: US  SCROTUM W/DOPPLER Result Date: 08/21/2023 CLINICAL DATA:  144615 Pain 144615 EXAM: SCROTAL ULTRASOUND DOPPLER  ULTRASOUND OF THE TESTICLES TECHNIQUE: Complete ultrasound examination of the testicles, epididymis, and other scrotal structures was performed. Color and spectral Doppler ultrasound were also utilized to evaluate blood flow to the testicles. COMPARISON:  None Available. FINDINGS: Right testicle Measurements: 3.9 x 2.2 x 2.6 cm. No mass or microlithiasis visualized. Left testicle Measurements: 4.6 x 2.4 x 2.8 cm. No mass or microlithiasis visualized. Right epididymis:  Normal in size and appearance. Left epididymis:  Normal in size and appearance. Hydrocele:  None visualized. Varicocele:  None visualized. Pulsed Doppler interrogation of both testes demonstrates normal low resistance arterial and venous waveforms bilaterally. IMPRESSION: No acute sonographic abnormality within the testicles; more specifically, no testicular mass, or findings of epididymo-orchitis, or changes of testicular torsion, at this time. Electronically Signed   By: Rogelia Myers M.D.   On: 08/21/2023 16:27     Procedures   Medications Ordered in the ED - No data to display                                  Medical Decision Making Differential diagnose includes but not limited to epididymitis, varicocele, hydrocele, testicular mass, testicular torsion, other  ED course: Patient had scrotal swelling today, this is already improved in the ED he reports.  There were no signs of infection and ultrasound was normal per radiology read.  There were no signs of cellulitis.  He did not have any injury.  Discussed with patient to follow-up with his PCP and/or urology and wear supportive underwear.  He was given strict return precautions.  Amount and/or Complexity of Data Reviewed Radiology: ordered.        Final diagnoses:  Scrotal swelling    ED Discharge Orders     None          Suellen Sherran DELENA DEVONNA 08/21/23 VINCENT Suzette Pac, MD 08/22/23 1116

## 2023-09-25 ENCOUNTER — Ambulatory Visit (INDEPENDENT_AMBULATORY_CARE_PROVIDER_SITE_OTHER): Admitting: Clinical

## 2023-09-25 DIAGNOSIS — F1020 Alcohol dependence, uncomplicated: Secondary | ICD-10-CM

## 2023-09-25 DIAGNOSIS — F4310 Post-traumatic stress disorder, unspecified: Secondary | ICD-10-CM | POA: Diagnosis not present

## 2023-09-25 DIAGNOSIS — F419 Anxiety disorder, unspecified: Secondary | ICD-10-CM | POA: Diagnosis not present

## 2023-09-25 DIAGNOSIS — F331 Major depressive disorder, recurrent, moderate: Secondary | ICD-10-CM

## 2023-09-25 NOTE — Progress Notes (Signed)
 IN PERSON   I connected with Sean Jordan on 09/25/23 at  1:00 PM EST in person and verified that I am speaking with the correct person using two identifiers.   Location: Patient: Office Provider: Office    I discussed the limitations of evaluation and management by telemedicine and the availability of in person appointments. The patient expressed understanding and agreed to proceed. (IN PERSON)   THERAPIST PROGRESS NOTE   Session Time: 1:00 PM-1:45 PM   Participation Level: Active   Behavioral Response: CasualAlertDepressed   Type of Therapy: Individual Therapy   Treatment Goals addressed: Coping   Interventions: CBT and Strength-based   Summary: Sean Jordan  is a 53 y.o. male who presents with Depression with Anxiety, PTSD, Alcohol and Tobacco Use Disorder. The OPT therapist worked with the patient for his ongoing OPT treatment. The OPT therapist utilized Motivational Interviewing to assist in creating therapeutic repore. The patient in the session was engaged and work in collaboration giving feedback about his triggers and symptoms over the past few weeks.The patient spoke about trial run through PCP with Vyvanse at 20mg  and feeling this needs to be increased  to help him with his attention, concentration, and focus and will be overviewing feedback with his prescriber. The patient was seen immediately after his last OPT session at the ER as he arrived and was in extreme discomfort from a injury sustained a day earlier creating a physical health problem with swollen testicles ; the patient was seen at [facility] for further evaluation and was released with no further identified health concern. The patient noted after another day or so the swelling went down on its on and he recovered.The OPT therapist utilized Cognitive Behavioral Therapy through cognitive restructuring as well as worked with the patient on coping strategies to assist in management of his mental health  symptoms. The OPT therapist continued working with the patient using organization tools helpful in keeping his focus on one task at a time through competition. The patient in this session spoke about his ongoing work to find another job as this has been his largest stressor and the patient continues to apply for other employment.   Suicidal/Homicidal: Nowithout intent/plan   Therapist Response: The OPT therapist worked with the patient for the patients scheduled session. The patient was engaged in his session and gave feedback in relation to triggers, symptoms, and behavior responses over the past few weeks.The OPT therapist worked with the patient utilizing an in session Cognitive Behavioral Therapy exercise. The patient has been implementing coping to deal with MH symptoms and spoke about working on his time management. The patient spoke about response to being  prescribed Vyvanse to assist with his attention, concentration, and focus and noted he feels a higher dose is needed and will review with his PCP. The OPT therapist worked with the patient on season specific coping skills, time management, and  ongoing work on smoking cessation .The patient will continue to work with his PCP, Cardiologist and other health providers .The OP therapist worked with the patient on coping skills for stress management  The OPT therapist worked with the patient on creating a better work/life balance and utilizing Summer season coping skills to manage his mood and anxiety. The patient additionally spoke about his recovery from physical health problem with swollen testicles post\ ER at [facility] visit sustained from a at home fall in the yard while mowing..The OPT therapist will continue treatment work with the patient in his  next scheduled session.   Plan: Return again in 2/3 weeks.   Diagnosis:      Axis I: Recurrent moderate MDD with anxiety, PTSD, Alcohol and Tobacco Use Disorder                            Axis II: No  diagnosis   Collaboration of Care: No additional collaboration of care for this session.     Patient/Guardian was advised Release of Information must be obtained prior to any record release in order to collaborate their care with an outside provider. Patient/Guardian was advised if they have not already done so to contact the registration department to sign all necessary forms in order for us  to release information regarding their care.    Consent: Patient/Guardian gives verbal consent for treatment and assignment of benefits for services provided during this visit. Patient/Guardian expressed understanding and agreed to proceed     I discussed the assessment and treatment plan with the patient. The patient was provided an opportunity to ask questions and all were answered. The patient agreed with the plan and demonstrated an understanding of the instructions.   The patient was advised to call back or seek an in-person evaluation if the symptoms worsen or if the condition fails to improve as anticipated.   I provided 45 minutes of face-to-face time during this encounter.   [name], LCSW   09/25/2023

## 2023-10-20 ENCOUNTER — Encounter (HOSPITAL_COMMUNITY): Payer: Self-pay

## 2023-10-20 ENCOUNTER — Ambulatory Visit (HOSPITAL_COMMUNITY)

## 2023-10-30 ENCOUNTER — Ambulatory Visit (HOSPITAL_COMMUNITY): Admitting: Clinical

## 2023-10-30 DIAGNOSIS — F4310 Post-traumatic stress disorder, unspecified: Secondary | ICD-10-CM | POA: Diagnosis not present

## 2023-10-30 DIAGNOSIS — F1020 Alcohol dependence, uncomplicated: Secondary | ICD-10-CM | POA: Diagnosis not present

## 2023-10-30 DIAGNOSIS — F17200 Nicotine dependence, unspecified, uncomplicated: Secondary | ICD-10-CM

## 2023-10-30 DIAGNOSIS — F419 Anxiety disorder, unspecified: Secondary | ICD-10-CM

## 2023-10-30 DIAGNOSIS — F331 Major depressive disorder, recurrent, moderate: Secondary | ICD-10-CM

## 2023-10-30 NOTE — Progress Notes (Addendum)
 IN PERSON   I connected with Sean Jordan on 10/30/23 at  1:00 PM EST in person and verified that I am speaking with the correct person using two identifiers.   Location: Patient: Office Provider: Office    I discussed the limitations of evaluation and management by telemedicine and the availability of in person appointments. The patient expressed understanding and agreed to proceed. (IN PERSON)   THERAPIST PROGRESS NOTE   Session Time: 1:00 PM-1:55 PM   Participation Level: Active   Behavioral Response: CasualAlertDepressed   Type of Therapy: Individual Therapy   Treatment Goals addressed: Coping   Interventions: CBT and Strength-based   Summary: Sean JONETTA. Jordan  is a 53 y.o. male who presents with Depression with Anxiety, PTSD, Alcohol ( Moderate)  and Tobacco Use Disorder ( Moderate). The OPT therapist worked with the patient for his ongoing OPT treatment. The OPT therapist utilized Motivational Interviewing to assist in creating therapeutic repore. The patient in the session was engaged and work in collaboration giving feedback about his triggers and symptoms over the past few weeks.The patient spoke his physical health, his job, his relationship, and his ongoing work to find new employment. The patient noted he has been active in putting out applications and his resume and is open to the idea of maybe having to work in office some of the time if he is not able to work the entire week from home..The OPT therapist utilized Cognitive Behavioral Therapy through cognitive restructuring as well as worked with the patient on coping strategies to assist in management of his mental health symptoms. The OPT therapist continued working with the patient using organization tools helpful in keeping his focus on one task at a time through competition. The patient in this session worked with the OPT therapist on blue print to help organize the patients time effort and energy towards his  individual goals. The patient spoke about his ongoing response to his medication. The patient spoke about his plan to work on his relationship without compromising his individual plans for the future..   Suicidal/Homicidal: Nowithout intent/plan   Therapist Response: The OPT therapist worked with the patient for the patients scheduled session. The patient was engaged in his session and gave feedback in relation to triggers, symptoms, and behavior responses over the past few weeks.The OPT therapist worked with the patient utilizing an in session Cognitive Behavioral Therapy exercise. The patient has been implementing coping to deal with MH symptoms and spoke about working on his time management. The patient spoke about response to med therapy. The OPT therapist worked with the patient on season specific coping skills, time management, and  ongoing  smoking cessation .The patient will continue to work with his PCP, Cardiologist and other health providers .The OP therapist worked with the patient on coping skills for stress management  The OPT therapist worked with the patient on creating a better work/life balance. The patient will continue to work on his goal of changing jobs through applying for opportunities he feels will be a good fit.The OPT therapist will continue treatment work with the patient in his next scheduled session.   Plan: Return again in 2/3 weeks.   Diagnosis:      Axis I: Recurrent moderate MDD with anxiety, PTSD, Alcohol Use (Moderate) and Tobacco Use Disorder (Moderate)                           Axis II: No diagnosis  Collaboration of Care: No additional collaboration of care for this session.     Patient/Guardian was advised Release of Information must be obtained prior to any record release in order to collaborate their care with an outside provider. Patient/Guardian was advised if they have not already done so to contact the registration department to sign all necessary forms  in order for us  to release information regarding their care.    Consent: Patient/Guardian gives verbal consent for treatment and assignment of benefits for services provided during this visit. Patient/Guardian expressed understanding and agreed to proceed     I discussed the assessment and treatment plan with the patient. The patient was provided an opportunity to ask questions and all were answered. The patient agreed with the plan and demonstrated an understanding of the instructions.   The patient was advised to call back or seek an in-person evaluation if the symptoms worsen or if the condition fails to improve as anticipated.   I provided 55 minutes of face-to-face time during this encounter.   Sean ONEIDA Pepper, LCSW   10/30/2023

## 2023-12-11 ENCOUNTER — Ambulatory Visit (INDEPENDENT_AMBULATORY_CARE_PROVIDER_SITE_OTHER): Admitting: Clinical

## 2023-12-11 DIAGNOSIS — F331 Major depressive disorder, recurrent, moderate: Secondary | ICD-10-CM

## 2023-12-11 DIAGNOSIS — F419 Anxiety disorder, unspecified: Secondary | ICD-10-CM

## 2023-12-11 DIAGNOSIS — F1020 Alcohol dependence, uncomplicated: Secondary | ICD-10-CM

## 2023-12-11 DIAGNOSIS — F17200 Nicotine dependence, unspecified, uncomplicated: Secondary | ICD-10-CM | POA: Diagnosis not present

## 2023-12-11 DIAGNOSIS — F4310 Post-traumatic stress disorder, unspecified: Secondary | ICD-10-CM

## 2023-12-11 NOTE — Progress Notes (Signed)
 IN PERSON   I connected with Sean Jordan on 12/11/23 at  1:00 PM EST in person and verified that I am speaking with the correct person using two identifiers.   Location: Patient: Office Provider: Office    I discussed the limitations of evaluation and management by telemedicine and the availability of in person appointments. The patient expressed understanding and agreed to proceed. (IN PERSON)   THERAPIST PROGRESS NOTE   Session Time: 1:00 PM-1:45 PM   Participation Level: Active   Behavioral Response: CasualAlertDepressed   Type of Therapy: Individual Therapy   Treatment Goals addressed: Coping   Interventions: CBT and Strength-based   Summary: Sean Jordan  is a 53 y.o. male who presents with Depression with Anxiety, PTSD, Alcohol ( Moderate)  and Tobacco Use Disorder ( Moderate). The OPT therapist worked with the patient for his ongoing OPT treatment. The OPT therapist utilized Motivational Interviewing to assist in creating therapeutic repore. The patient in the session was engaged and work in collaboration giving feedback about his triggers and symptoms over the past few weeks.The patient spoke his physical health, his job, his relationship, and his ongoing work to find new employment and implementing more leisure recently involving his love for the [employer] [place] . The patient noted he has been active in putting out applications and his resume and is open to the idea of maybe having to work in office some of the time if he is not able to work the entire week from home..The OPT therapist utilized Cognitive Behavioral Therapy through cognitive restructuring as well as worked with the patient on coping strategies to assist in management of his mental health symptoms. The OPT therapist continued working with the patient using organization tools helpful in keeping his focus on one task at a time through competition. The patient in this session worked with  the OPT therapist on blue print to help continue steps towards his individual goals. The patient spoke about his ongoing response to his medication noting a recent increase in one of his medications that he will be trial running over the next 4 weeks.    Suicidal/Homicidal: Nowithout intent/plan   Therapist Response: The OPT therapist worked with the patient for the patients scheduled session. The patient was engaged in his session and gave feedback in relation to triggers, symptoms, and behavior responses over the past few weeks.The OPT therapist worked with the patient utilizing an in session Cognitive Behavioral Therapy exercise. The patient has been implementing coping to deal with MH symptoms and spoke about working on his time management and implementing balance of leisure. The patient spoke about response to med therapy and is trail running a increase in his dosage over the next 4 weeks. The OPT therapist worked with the patient on season specific coping skills, time management, and  ongoing  smoking cessation .The patient will continue to work with his PCP, Cardiologist and other health providers .The OP therapist worked with the patient on coping skills for stress management  The OPT therapist worked with the patient on creating a better work/life balance and the patient noted his involvement most recently in watching [place]  [employer].The OPT therapist will continue treatment work with the patient in his next scheduled session.   Plan: Return again in 2/3 weeks.   Diagnosis:      Axis I: Recurrent moderate MDD with anxiety, PTSD, Alcohol Use (Moderate) and Tobacco Use Disorder (Moderate)  Axis II: No diagnosis   Collaboration of Care: No additional collaboration of care for this session.     Patient/Guardian was advised Release of Information must be obtained prior to any record release in order to collaborate their care with an outside provider. Patient/Guardian was  advised if they have not already done so to contact the registration department to sign all necessary forms in order for us  to release information regarding their care.    Consent: Patient/Guardian gives verbal consent for treatment and assignment of benefits for services provided during this visit. Patient/Guardian expressed understanding and agreed to proceed     I discussed the assessment and treatment plan with the patient. The patient was provided an opportunity to ask questions and all were answered. The patient agreed with the plan and demonstrated an understanding of the instructions.   The patient was advised to call back or seek an in-person evaluation if the symptoms worsen or if the condition fails to improve as anticipated.   I provided 45 minutes of face-to-face time during this encounter.   Sean ONEIDA Pepper, LCSW   12/11/2023

## 2024-01-15 ENCOUNTER — Ambulatory Visit (INDEPENDENT_AMBULATORY_CARE_PROVIDER_SITE_OTHER): Admitting: Clinical

## 2024-01-15 DIAGNOSIS — F419 Anxiety disorder, unspecified: Secondary | ICD-10-CM | POA: Diagnosis not present

## 2024-01-15 DIAGNOSIS — F4310 Post-traumatic stress disorder, unspecified: Secondary | ICD-10-CM | POA: Diagnosis not present

## 2024-01-15 DIAGNOSIS — F331 Major depressive disorder, recurrent, moderate: Secondary | ICD-10-CM

## 2024-01-15 DIAGNOSIS — F17200 Nicotine dependence, unspecified, uncomplicated: Secondary | ICD-10-CM

## 2024-01-15 DIAGNOSIS — F1020 Alcohol dependence, uncomplicated: Secondary | ICD-10-CM | POA: Diagnosis not present

## 2024-01-15 NOTE — Progress Notes (Signed)
 IN PERSON   I connected with Sean Jordan on 01/15/24 at  1:00 PM EST in person and verified that I am speaking with the correct person using two identifiers.   Location: Patient: Office Provider: Office    I discussed the limitations of evaluation and management by telemedicine and the availability of in person appointments. The patient expressed understanding and agreed to proceed. (IN PERSON)   THERAPIST PROGRESS NOTE   Session Time: 1:00 PM-1:45 PM   Participation Level: Active   Behavioral Response: CasualAlertDepressed   Type of Therapy: Individual Therapy   Treatment Goals addressed: Coping   Interventions: CBT and Strength-based   Summary: Sean JONETTA. Jordan  is a 53 y.o. male who presents with Depression with Anxiety, PTSD, Alcohol ( Moderate)  and Tobacco Use Disorder ( Moderate). The OPT therapist worked with the patient for his ongoing OPT treatment. The OPT therapist utilized Motivational Interviewing to assist in creating therapeutic repore. The patient in the session was engaged and work in collaboration giving feedback about his triggers and symptoms over the past few weeks.The patient spoke his physical health, his job, his relationship, and his ongoing work to find new employment and implementing more leisure continuing his involving his love for the [employer] [place] . The patient noted he has continued to be active in putting out applications and his resume , however, acknowledged giving the upcoming holidays it may be after the first of the year before he gets any call backs. .The OPT therapist utilized Cognitive Behavioral Therapy through cognitive restructuring as well as worked with the patient on coping strategies to assist in management of his mental health symptoms. The OPT therapist continued working with the patient using organization tools helpful in keeping his focus on one task at a time through competition. The patient in this session  worked with the OPT therapist on blue print to help continue steps towards his individual goals. The patient spoke about his ongoing response to his medication noting the prior increase in one of his medications has been helpful in management of his MH symptoms.    Suicidal/Homicidal: Nowithout intent/plan   Therapist Response: The OPT therapist worked with the patient for the patients scheduled session. The patient was engaged in his session and gave feedback in relation to triggers, symptoms, and behavior responses over the past few weeks.The OPT therapist worked with the patient utilizing an in session Cognitive Behavioral Therapy exercise. The patient has been implementing coping to deal with MH symptoms and spoke about working on his time management and implementing balance of leisure. The patient spoke about response to med therapy andrecent trail running at increase in his dosage has been more helpful In management of his  mH symptoms. The OPT therapist worked with the patient on season specific coping skills, time management, and  ongoing  smoking cessation and currently using a tracker app to help him with data to assist his efforts in smoking cessation .The patient will continue to work with his PCP, Cardiologist and other health providers .The OP therapist worked with the patient on coping skills for stress management  The OPT therapist worked with the patient on creating a better work/life balance and the patient noted his involvement most recently in watching [place]  Football. The patient spoke about plans for upcoming Thanksgiving holiday and spending time with family. The OPT therapist will continue treatment work with the patient in his next scheduled session.   Plan: Return again in 2/3 weeks.   Diagnosis:  Axis I: Recurrent moderate MDD with anxiety, PTSD, Alcohol Use (Moderate) and Tobacco Use Disorder (Moderate)                           Axis II: No diagnosis   Collaboration of  Care: No additional collaboration of care for this session.     Patient/Guardian was advised Release of Information must be obtained prior to any record release in order to collaborate their care with an outside provider. Patient/Guardian was advised if they have not already done so to contact the registration department to sign all necessary forms in order for us  to release information regarding their care.    Consent: Patient/Guardian gives verbal consent for treatment and assignment of benefits for services provided during this visit. Patient/Guardian expressed understanding and agreed to proceed     I discussed the assessment and treatment plan with the patient. The patient was provided an opportunity to ask questions and all were answered. The patient agreed with the plan and demonstrated an understanding of the instructions.   The patient was advised to call back or seek an in-person evaluation if the symptoms worsen or if the condition fails to improve as anticipated.   I provided 45 minutes of face-to-face time during this encounter.   [name], LCSW   01/15/2024

## 2024-02-19 ENCOUNTER — Ambulatory Visit (HOSPITAL_COMMUNITY): Admitting: Clinical

## 2024-02-19 DIAGNOSIS — F419 Anxiety disorder, unspecified: Secondary | ICD-10-CM | POA: Diagnosis not present

## 2024-02-19 DIAGNOSIS — F1020 Alcohol dependence, uncomplicated: Secondary | ICD-10-CM | POA: Diagnosis not present

## 2024-02-19 DIAGNOSIS — F17210 Nicotine dependence, cigarettes, uncomplicated: Secondary | ICD-10-CM | POA: Diagnosis not present

## 2024-02-19 DIAGNOSIS — F331 Major depressive disorder, recurrent, moderate: Secondary | ICD-10-CM

## 2024-02-19 DIAGNOSIS — F17200 Nicotine dependence, unspecified, uncomplicated: Secondary | ICD-10-CM

## 2024-02-19 DIAGNOSIS — F4310 Post-traumatic stress disorder, unspecified: Secondary | ICD-10-CM

## 2024-02-19 NOTE — Progress Notes (Addendum)
 Virtual Visit via Video Note  I connected with Sean Jordan on 02/19/2024 at  4:00 PM EST by a video enabled telemedicine application and verified that I am speaking with the correct person using two identifiers.  Location: Patient: home Provider: office   I discussed the limitations of evaluation and management by telemedicine and the availability of in person appointments. The patient expressed understanding and agreed to proceed.  THERAPIST PROGRESS NOTE   Session Time: 4:00 PM-4:30 PM   Participation Level: Active   Behavioral Response: CasualAlertDepressed   Type of Therapy: Individual Therapy   Treatment Goals addressed: Coping   Interventions: CBT and Strength-based   Summary: Sean Jordan  is a 53 y.o. male who presents with Depression with Anxiety, PTSD, Alcohol ( Moderate)  and Tobacco Use Disorder ( Moderate). The OPT therapist worked with the patient for his ongoing OPT treatment. The OPT therapist utilized Motivational Interviewing to assist in creating therapeutic repore. The patient in the session was engaged and work in collaboration giving feedback about his triggers and symptoms over the past few weeks. The patient spoke about getting the flu and has since recovered.The patient spoke his physical health, his job, his relationship with his partner going through the stress of putting in a offer for a house.The OPT therapist utilized Cognitive Behavioral Therapy through cognitive restructuring as well as worked with the patient on coping strategies to assist in management of his mental health symptoms. The OPT therapist continued working with the patient using organization tools helpful in keeping his focus on one task at a time through competition. The patient in this session worked with the OPT therapist on blue print to help continue steps towards his individual goals primarly getting another job offer and changing jobs due to ongoing distain for current  employer. The patient spoke about his ongoing response to his medication. The patient spoke about his interest in purchasing land that is owned by his family that [place] land. The patient spoke about his hopes to be able to take a vacation to [place]/ [place] area around New Years.   Suicidal/Homicidal: Nowithout intent/plan   Therapist Response: The OPT therapist worked with the patient for the patients scheduled session. The patient was engaged in his session and gave feedback in relation to triggers, symptoms, and behavior responses over the past few weeks.The OPT therapist worked with the patient utilizing an in session Cognitive Behavioral Therapy exercise. The patient has been implementing coping to deal with MH symptoms and spoke about working on his time management and implementing balance of leisure. The patient is continuing to work on smoking cessation. The patient spoke about response to med therapy. The OPT therapist worked with the patient who has been working on potentially buying land that is part of a family farm. The OPT therapist worked with the patient on creating a better work/life balance. The patient spoke about plans for upcoming Christmas holiday. The patient despite it being the holiday season spoke about continuing to put out his resume in a effort to get another job. The OPT therapist will continue treatment work with the patient in his next scheduled session.   Plan: Return again in 2/3 weeks.   Diagnosis:      Axis I: Recurrent moderate MDD with anxiety, PTSD, Alcohol Use (Moderate) and Tobacco Use Disorder (Moderate)  Axis II: No diagnosis   Collaboration of Care: No additional collaboration of care for this session.     Patient/Guardian was advised Release of Information must be obtained prior to any record release in order to collaborate their care with an outside provider. Patient/Guardian was advised if they have not already done so  to contact the registration department to sign all necessary forms in order for us  to release information regarding their care.    Consent: Patient/Guardian gives verbal consent for treatment and assignment of benefits for services provided during this visit. Patient/Guardian expressed understanding and agreed to proceed     I discussed the assessment and treatment plan with the patient. The patient was provided an opportunity to ask questions and all were answered. The patient agreed with the plan and demonstrated an understanding of the instructions.   The patient was advised to call back or seek an in-person evaluation if the symptoms worsen or if the condition fails to improve as anticipated.   I provided 30 minutes of non-face-to-face time during this encounter.   [name], LCSW   02/19/2024

## 2024-04-01 ENCOUNTER — Ambulatory Visit (HOSPITAL_COMMUNITY): Admitting: Clinical

## 2024-04-01 DIAGNOSIS — F331 Major depressive disorder, recurrent, moderate: Secondary | ICD-10-CM

## 2024-04-01 DIAGNOSIS — F4310 Post-traumatic stress disorder, unspecified: Secondary | ICD-10-CM

## 2024-04-01 DIAGNOSIS — F1020 Alcohol dependence, uncomplicated: Secondary | ICD-10-CM

## 2024-04-01 DIAGNOSIS — F17200 Nicotine dependence, unspecified, uncomplicated: Secondary | ICD-10-CM

## 2024-04-01 NOTE — Progress Notes (Signed)
 Virtual Visit via Video Note   I connected with Sean Jordan on 04/01/2024 at  2:00 PM EST by a video enabled telemedicine application and verified that I am speaking with the correct person using two identifiers.   Location: Patient: home Provider: office   I discussed the limitations of evaluation and management by telemedicine and the availability of in person appointments. The patient expressed understanding and agreed to proceed.   THERAPIST PROGRESS NOTE   Session Time: 2:00 PM-2:30 PM   Participation Level: Active   Behavioral Response: CasualAlertDepressed   Type of Therapy: Individual Therapy   Treatment Goals addressed: Coping   Interventions: CBT and Strength-based   Summary: Sean Jordan  is a 54 y.o. male who presents with Depression with Anxiety, PTSD, Alcohol ( Moderate)  and Tobacco Use Disorder ( Moderate). The OPT therapist worked with the patient for his ongoing OPT treatment. The OPT therapist utilized Motivational Interviewing to assist in creating therapeutic repore. The patient in the session was engaged and work in collaboration giving feedback about his triggers and symptoms over the past few weeks through the holidays and into the new year. The patient spoke about his physical health, his job, his relationship with his partner.The OPT therapist utilized Cognitive Behavioral Therapy through cognitive restructuring as well as worked with the patient on coping strategies to assist in management of his mental health symptoms. The OPT therapist continued working with the patient using organization tools helpful in keeping his focus on one task at a time through competition. The patient in this session worked with the OPT therapist on blue print to help continue steps towards his individual goals primarly getting another job offer and changing jobs. The patient spoke about his ongoing response to his medication. The patient spoke about ongoing work on  smoking cessation and trying smoking cessation hypnosis.   Suicidal/Homicidal: Nowithout intent/plan   Therapist Response: The OPT therapist worked with the patient for the patients scheduled session. The patient was engaged in his session and gave feedback in relation to triggers, symptoms, and behavior responses over the past few weeks through the end of the year and into the new year. The patient spoke about struggle with recent inclement weather and difficulty with being able to travel noting still having solid ice in his driveway and yard..The OPT therapist worked with the patient utilizing an in session Cognitive Behavioral Therapy exercise. The patient has been implementing coping to deal with MH symptoms and spoke about working on his time management and implementing balance of leisure. The patient is continuing to work on smoking cessation recently working to implement smoking cessation hypnosis.The patient spoke about response to med therapy. The OPT therapist worked with the patient on creating a better work/life balance. The patient spoke about being in the process of taking possession of land that was his Mothers, but also working to get the taxes owed on the land paid current. The OPT therapist will continue treatment work with the patient in his next scheduled session.   Plan: Return again in 2/3 weeks.   Diagnosis:      Axis I: Recurrent moderate MDD with anxiety, PTSD, Alcohol Use (Moderate) and Tobacco Use Disorder (Moderate)                           Axis II: No diagnosis   Collaboration of Care: No additional collaboration of care for this session.     Patient/Guardian was advised  Release of Information must be obtained prior to any record release in order to collaborate their care with an outside provider. Patient/Guardian was advised if they have not already done so to contact the registration department to sign all necessary forms in order for us  to release information regarding  their care.    Consent: Patient/Guardian gives verbal consent for treatment and assignment of benefits for services provided during this visit. Patient/Guardian expressed understanding and agreed to proceed     I discussed the assessment and treatment plan with the patient. The patient was provided an opportunity to ask questions and all were answered. The patient agreed with the plan and demonstrated an understanding of the instructions.   The patient was advised to call back or seek an in-person evaluation if the symptoms worsen or if the condition fails to improve as anticipated.   I provided 30 minutes of non-face-to-face time during this encounter.   [name], LCSW   03/01/2025

## 2024-04-29 ENCOUNTER — Ambulatory Visit (HOSPITAL_COMMUNITY): Admitting: Clinical

## 2024-05-06 ENCOUNTER — Ambulatory Visit (HOSPITAL_COMMUNITY): Admitting: Clinical
